# Patient Record
Sex: Male | Born: 1955 | Race: White | Hispanic: No | Marital: Married | Smoking: Former smoker
Health system: Southern US, Community
[De-identification: ages and names within clinical notes are randomized; demographics above are authoritative.]

## PROBLEM LIST (undated history)

## (undated) DIAGNOSIS — I10 Essential (primary) hypertension: Secondary | ICD-10-CM

## (undated) DIAGNOSIS — D3A029 Benign carcinoid tumor of the large intestine, unspecified portion: Secondary | ICD-10-CM

## (undated) DIAGNOSIS — E785 Hyperlipidemia, unspecified: Secondary | ICD-10-CM

## (undated) HISTORY — DX: Hyperlipidemia, unspecified: E78.5

## (undated) HISTORY — PX: APPENDECTOMY: SHX54

---

## 1998-06-18 ENCOUNTER — Encounter: Payer: Self-pay | Admitting: Emergency Medicine

## 1998-06-18 ENCOUNTER — Inpatient Hospital Stay (HOSPITAL_COMMUNITY): Admission: EM | Admit: 1998-06-18 | Discharge: 1998-06-19 | Payer: Self-pay | Admitting: Emergency Medicine

## 1998-06-19 ENCOUNTER — Encounter: Payer: Self-pay | Admitting: Cardiology

## 2008-03-02 ENCOUNTER — Encounter: Admission: RE | Admit: 2008-03-02 | Discharge: 2008-03-02 | Payer: Self-pay | Admitting: Obstetrics and Gynecology

## 2009-03-31 ENCOUNTER — Encounter: Admission: RE | Admit: 2009-03-31 | Discharge: 2009-03-31 | Payer: Self-pay | Admitting: Gastroenterology

## 2009-04-05 ENCOUNTER — Ambulatory Visit (HOSPITAL_COMMUNITY): Admission: EM | Admit: 2009-04-05 | Discharge: 2009-04-06 | Payer: Self-pay | Admitting: Emergency Medicine

## 2011-01-10 NOTE — Op Note (Signed)
[name], [name]              ACCOUNT NO.:  [id number]   MEDICAL RECORD NO.:  [id number]          PATIENT TYPE:  EMS   LOCATION:  ED                           FACILITY:  [facility]   PHYSICIAN:  James L. Malon Kindle., M.D.DATE OF BIRTH:  Oct 25, 1955   DATE OF PROCEDURE:  04/06/2009  DATE OF DISCHARGE:                               OPERATIVE REPORT   PROCEDURE:  Esophagogastroduodenoscopy with removal of food impaction.   MEDICATIONS:  Cetacaine spray, fentanyl 50 mcg, Versed 4 mg IV.   INDICATIONS:  The patient was eating steak.  He has been unable to  swallow ever since supper.  Had a barium swallow several days ago  showing reflux but no clear stricture.   DESCRIPTION OF PROCEDURE:  The procedure, including the risk and  benefits, was explained to the patient and wife and consent obtained.  In the  left lateral decubitus position, the Pentax upper scope was  inserted.  Esophagus was full of liquid.  We sucked this out.  There was  a large bolus of several pieces of meat in the distal esophagus.  With  air insufflation and the glucagon that had been given the ER and pushing  gently on this with the scope, the pieces passed on through into the  stomach area.  The area was irrigated and the rest of the pieces were  washed through the scope and easily passed.  A complete endoscopy was  performed and a large piece of meat was seen in the stomach.  Duodenum,  bulb and second portion was unremarkable.  Pyloric channel antrum and  body were all normal.  Scope was withdrawn back in the stomach.  There  was a widely patent GE junction but no stricture.  Was markedly  irritated and edematous and friable from the food impaction.  There were  no further residual pieces.  The scope was withdrawn.  The patient  tolerated the procedure well.   ASSESSMENT:  Food impaction with meat in the distal esophagus removed.   PLAN:  Will start the patient on Prilosec b.i.d. and have him follow up  in the  office with Dr. Evette Cristal in 2-3 weeks.           ______________________________  Llana Aliment Malon Kindle., M.D.     Waldron Session  D:  04/06/2009  T:  04/06/2009  Job:  [id number]   cc:   C. Duane Lope, M.D.  Fax: [phone]

## 2011-06-09 ENCOUNTER — Encounter (INDEPENDENT_AMBULATORY_CARE_PROVIDER_SITE_OTHER): Payer: Self-pay | Admitting: General Surgery

## 2011-06-09 ENCOUNTER — Ambulatory Visit (INDEPENDENT_AMBULATORY_CARE_PROVIDER_SITE_OTHER): Payer: BC Managed Care – PPO | Admitting: General Surgery

## 2011-06-09 VITALS — BP 152/98 | HR 64 | Temp 96.6°F | Resp 20 | Ht 68.0 in | Wt 180.1 lb

## 2011-06-09 DIAGNOSIS — K6389 Other specified diseases of intestine: Secondary | ICD-10-CM

## 2011-06-09 NOTE — Progress Notes (Signed)
Subjective:   Mass in colon  Patient ID: [name], male   DOB: 1955/11/08, 55 y.o.   MRN: [id number]  HPI  Patient is a 55 year old male here through the courtesy of Dr. Evette Cristal with a new finding of a submucosal cecal mass. The patient recently underwent a screening colonoscopy which was his initial full colonoscopy. He was having no preoperative symptoms. On questioning he has had no symptoms to suggest carcinoid syndrome. Colonoscopy revealed a small submucosal mass adjacent to the appendiceal orifice. It appeared to be about 1 cm. A biopsy was performed. A small portion of the biopsy revealed abnormal cells with clear cytoplasm of concern for a goblet cell carcinoid. Lipoma or other process could not be ruled out.  Past Medical History  Diagnosis Date  . Hyperlipidemia    Past Surgical History  Procedure Date  . Appendectomy    Current Outpatient Prescriptions  Medication Sig Dispense Refill  . fish oil-omega-3 fatty acids 1000 MG capsule Take 1,200 mg by mouth daily.        Marland Kitchen Specialty Vitamins Products (RA CENTRAL-VITE CARDIO PO) Take by mouth daily.         No Known Allergies History  Substance Use Topics  . Smoking status: Former Games developer  . Smokeless tobacco: Never Used  . Alcohol Use: Yes    Review of Systems  HENT: Positive for trouble swallowing.   Respiratory: Negative.   Cardiovascular: Negative.   Gastrointestinal: Negative for nausea, vomiting, abdominal pain, diarrhea, constipation, blood in stool and abdominal distention.  Genitourinary: Negative.   Hematological: Negative.        Objective:   Physical Exam General: Well-developed Caucasian male appears healthy Skin: Warm and dry no rash or infection HEENT: No palpable masses or thyromegaly. Sclera nonicteric. Lymph nodes: No cervical, supraclavicular, or inguinal nodes palpable Lungs: Clear without wheezing or increased work of breathing Cardiovascular: Regular rate and rhythm without murmur. No  edema or JVD. Peripheral pulses intact. Abdomen: Nondistended. Well-healed incision right lower quadrant without hernias. Soft and nontender without palpable masses or organomegaly. Extremities: No joint swelling or deformity Neurologic: Alert and Foley were noted. Gait normal.    Assessment:     Healthy 55 year old male with a small submucosal mass noted in the cecum near the appendiceal orifice. He is status post open appendectomy in his 18s. Biopsy has suggested the possibility of a carcinoid tumor. Lipoma was also mentioned in the differential diagnosis pathologically. We can also occasionally see a submucosal mass due to an inverted appendiceal stump post appendectomy.  Dr. Evette Cristal is out of town today I would like to discuss personally with him his impression of colonoscopy. If any more definitive biopsy could be obtained this might be helpful to try to avoid surgery. However based on the current findings I think there is a significant risk that this is a carcinoid tumor and that he would be best served with resection. This could be done laparoscopically.  I discussed this all with the patient including possible diagnoses and indications and nature of the surgery. We discussed risks of anesthetic complications, bleeding, infection, anastomotic leak, possible need for procedure. I went back in touch with him this is a chance to discuss this with Dr. Evette Cristal.    Plan:     We'll discuss the possibility of further diagnostic procedure with Dr. Evette Cristal. If this is not felt to be feasible then we will plan to proceed with laparoscopic partial colectomy.

## 2011-06-09 NOTE — Patient Instructions (Signed)
I will call Monday orTuesday after speaking to Dr Evette Cristal

## 2011-06-13 ENCOUNTER — Other Ambulatory Visit (INDEPENDENT_AMBULATORY_CARE_PROVIDER_SITE_OTHER): Payer: Self-pay

## 2011-07-13 ENCOUNTER — Other Ambulatory Visit (INDEPENDENT_AMBULATORY_CARE_PROVIDER_SITE_OTHER): Payer: Self-pay

## 2011-07-31 ENCOUNTER — Encounter (HOSPITAL_COMMUNITY): Payer: Self-pay

## 2011-08-03 ENCOUNTER — Encounter (HOSPITAL_COMMUNITY): Payer: Self-pay

## 2011-08-03 ENCOUNTER — Ambulatory Visit (HOSPITAL_COMMUNITY)
Admission: RE | Admit: 2011-08-03 | Discharge: 2011-08-03 | Disposition: A | Discharge status: Home / Self Care | Payer: BC Managed Care – PPO | Source: Ambulatory Visit | Attending: General Surgery | Admitting: General Surgery

## 2011-08-03 ENCOUNTER — Encounter (HOSPITAL_COMMUNITY)
Admission: RE | Admit: 2011-08-03 | Discharge: 2011-08-03 | Disposition: A | Discharge status: Home / Self Care | Payer: BC Managed Care – PPO | Source: Ambulatory Visit | Attending: General Surgery | Admitting: General Surgery

## 2011-08-03 ENCOUNTER — Other Ambulatory Visit: Payer: Self-pay

## 2011-08-03 DIAGNOSIS — Z01812 Encounter for preprocedural laboratory examination: Secondary | ICD-10-CM | POA: Insufficient documentation

## 2011-08-03 DIAGNOSIS — I10 Essential (primary) hypertension: Secondary | ICD-10-CM | POA: Insufficient documentation

## 2011-08-03 DIAGNOSIS — K639 Disease of intestine, unspecified: Secondary | ICD-10-CM | POA: Insufficient documentation

## 2011-08-03 DIAGNOSIS — Z01818 Encounter for other preprocedural examination: Secondary | ICD-10-CM | POA: Insufficient documentation

## 2011-08-03 DIAGNOSIS — Z01810 Encounter for preprocedural cardiovascular examination: Secondary | ICD-10-CM | POA: Insufficient documentation

## 2011-08-03 HISTORY — DX: Essential (primary) hypertension: I10

## 2011-08-03 LAB — BASIC METABOLIC PANEL
Calcium: 9.7 mg/dL (ref 8.4–10.5)
GFR calc Af Amer: >90 mL/min (ref >90)
GFR calc non Af Amer: >90 mL/min (ref >90)
Glucose, Bld: 93 mg/dL (ref 70–99)
Potassium: 4.2 mEq/L (ref 3.5–5.1)
Sodium: 140 mEq/L (ref 135–145)

## 2011-08-03 LAB — SURGICAL PCR SCREEN
MRSA, PCR: NEGATIVE
Staphylococcus aureus: NEGATIVE

## 2011-08-03 LAB — CBC
MCH: 29.2 pg (ref 26.0–34.0)
MCHC: 35 g/dL (ref 30.0–36.0)
Platelets: 131 10*3/uL — ABNORMAL LOW (ref 150–400)
RDW: 12.5 % (ref 11.5–15.5)

## 2011-08-03 NOTE — Pre-Procedure Instructions (Signed)
EKG AND CXR WERE DONE TODAY AT [facility] PREOP BECAUSE OF HX OF HYPERTENSION AND B/P ELEVATED TODAY

## 2011-08-03 NOTE — Patient Instructions (Signed)
20 RECE ZECHMAN  08/03/2011   Your procedure is scheduled on:  Tuesday 12/11 AT 8:30 AM  Report to [facility] at 6:30 AM.  Call this number if you have problems the morning of surgery: [phone]   Remember:FOLLOW BOWEL PREP AND CLEAR LIQUID DIET INSTRUCTIONS FROM DR. HOXWORTH'S OFFICE   Do not eat food OR DRINK ANYTHING AFTER MIDNIGHT THE NIGHT BEFORE YOUR SURGERY  NO ASPIRIN OR HERBAL MEDS OR BLOOD THINNERS 7 DAYS PRIOR TO SURGERY  Take these medicines the morning of surgery with A SIP OF WATER: NO MEDICINE TO TAKE   Do not wear jewelry, make-up or nail polish.  Do not wear lotions, powders, or perfumes. You may wear deodorant.  Do not shave 48 hours prior to surgery.  Do not bring valuables to the hospital.  Contacts, dentures or bridgework may not be worn into surgery.  Leave suitcase in the car. After surgery it may be brought to your room.  For patients admitted to the hospital, checkout time is 11:00 AM the day of discharge.   Patients discharged the day of surgery will not be allowed to drive home.    Special Instructions: CHG Shower Use Special Wash: 1/2 bottle night before surgery and 1/2 bottle morning of surgery.   Please read over the following fact sheets that you were given: Blood Transfusion Information and MRSA Information

## 2011-08-08 ENCOUNTER — Inpatient Hospital Stay (HOSPITAL_COMMUNITY)
Admission: RE | Admit: 2011-08-08 | Discharge: 2011-08-11 | DRG: 149 | Disposition: A | Discharge status: Home / Self Care | Payer: BC Managed Care – PPO | Source: Ambulatory Visit | Attending: General Surgery | Admitting: General Surgery

## 2011-08-08 ENCOUNTER — Encounter (HOSPITAL_COMMUNITY): Payer: Self-pay | Admitting: Anesthesiology

## 2011-08-08 ENCOUNTER — Other Ambulatory Visit (INDEPENDENT_AMBULATORY_CARE_PROVIDER_SITE_OTHER): Payer: Self-pay | Admitting: General Surgery

## 2011-08-08 ENCOUNTER — Inpatient Hospital Stay (HOSPITAL_COMMUNITY): Payer: BC Managed Care – PPO | Admitting: Anesthesiology

## 2011-08-08 ENCOUNTER — Encounter (HOSPITAL_COMMUNITY)
Admission: RE | Disposition: A | Discharge status: Home / Self Care | Payer: Self-pay | Source: Ambulatory Visit | Attending: General Surgery

## 2011-08-08 ENCOUNTER — Encounter (HOSPITAL_COMMUNITY): Payer: Self-pay

## 2011-08-08 DIAGNOSIS — C180 Malignant neoplasm of cecum: Principal | ICD-10-CM | POA: Diagnosis present

## 2011-08-08 DIAGNOSIS — D3A029 Benign carcinoid tumor of the large intestine, unspecified portion: Secondary | ICD-10-CM

## 2011-08-08 DIAGNOSIS — D375 Neoplasm of uncertain behavior of rectum: Secondary | ICD-10-CM

## 2011-08-08 DIAGNOSIS — D371 Neoplasm of uncertain behavior of stomach: Secondary | ICD-10-CM

## 2011-08-08 DIAGNOSIS — D378 Neoplasm of uncertain behavior of other specified digestive organs: Secondary | ICD-10-CM

## 2011-08-08 DIAGNOSIS — I10 Essential (primary) hypertension: Secondary | ICD-10-CM | POA: Insufficient documentation

## 2011-08-08 HISTORY — DX: Benign carcinoid tumor of the large intestine, unspecified portion: D3A.029

## 2011-08-08 HISTORY — PX: OTHER SURGICAL HISTORY: SHX169

## 2011-08-08 LAB — CBC
HCT: 45.8 % (ref 39.0–52.0)
MCV: 83 fL (ref 78.0–100.0)
RBC: 5.52 MIL/uL (ref 4.22–5.81)
WBC: 12 10*3/uL — ABNORMAL HIGH (ref 4.0–10.5)

## 2011-08-08 LAB — CREATININE, SERUM: GFR calc Af Amer: >90 mL/min (ref >90)

## 2011-08-08 LAB — TYPE AND SCREEN
ABO/RH(D): A POS
Antibody Screen: NEGATIVE

## 2011-08-08 SURGERY — LAPAROSCOPIC PARTIAL COLECTOMY
Anesthesia: General | Site: Abdomen | Wound class: Clean Contaminated

## 2011-08-08 MED ORDER — LIDOCAINE HCL (CARDIAC) 20 MG/ML IV SOLN
INTRAVENOUS | Status: DC | PRN
  Administered 2011-08-08: 100 mg via INTRAVENOUS

## 2011-08-08 MED ORDER — ACETAMINOPHEN 10 MG/ML IV SOLN
INTRAVENOUS | Status: DC | PRN
  Administered 2011-08-08: 1000 mg via INTRAVENOUS

## 2011-08-08 MED ORDER — KCL IN DEXTROSE-NACL 20-5-0.9 MEQ/L-%-% IV SOLN
INTRAVENOUS | Status: DC
  Administered 2011-08-08 – 2011-08-10 (×4): via INTRAVENOUS
  Filled 2011-08-08 (×8): qty 1000

## 2011-08-08 MED ORDER — SUCCINYLCHOLINE CHLORIDE 20 MG/ML IJ SOLN
INTRAMUSCULAR | Status: DC | PRN
  Administered 2011-08-08: 100 mg via INTRAVENOUS

## 2011-08-08 MED ORDER — DEXTROSE 5 % IV SOLN
2.0000 g | INTRAVENOUS | Status: AC
  Administered 2011-08-08: 2 g via INTRAVENOUS

## 2011-08-08 MED ORDER — OXYCODONE-ACETAMINOPHEN 5-325 MG PO TABS: 1.0000 | ORAL_TABLET | ORAL | Status: DC | PRN

## 2011-08-08 MED ORDER — MIDAZOLAM HCL 5 MG/5ML IJ SOLN
INTRAMUSCULAR | Status: DC | PRN
  Administered 2011-08-08: 2 mg via INTRAVENOUS
  Administered 2011-08-08: 1 mg via INTRAVENOUS

## 2011-08-08 MED ORDER — ALVIMOPAN 12 MG PO CAPS
12.0000 mg | ORAL_CAPSULE | Freq: Two times a day (BID) | ORAL | Status: DC
  Administered 2011-08-09 – 2011-08-11 (×5): 12 mg via ORAL
  Filled 2011-08-08 (×6): qty 1

## 2011-08-08 MED ORDER — HYDROMORPHONE HCL PF 1 MG/ML IJ SOLN
INTRAMUSCULAR | Status: AC
  Filled 2011-08-08: qty 1

## 2011-08-08 MED ORDER — ONDANSETRON HCL 4 MG/2ML IJ SOLN
INTRAMUSCULAR | Status: DC | PRN
  Administered 2011-08-08: 4 mg via INTRAVENOUS

## 2011-08-08 MED ORDER — ROCURONIUM BROMIDE 100 MG/10ML IV SOLN
INTRAVENOUS | Status: DC | PRN
  Administered 2011-08-08 (×2): 10 mg via INTRAVENOUS
  Administered 2011-08-08: 50 mg via INTRAVENOUS

## 2011-08-08 MED ORDER — ACETAMINOPHEN 10 MG/ML IV SOLN
INTRAVENOUS | Status: AC
  Filled 2011-08-08: qty 100

## 2011-08-08 MED ORDER — SODIUM CHLORIDE 0.9 % IJ SOLN
INTRAMUSCULAR | Status: DC | PRN
  Administered 2011-08-08: 20 mL via INTRAVENOUS

## 2011-08-08 MED ORDER — HYDROMORPHONE HCL PF 1 MG/ML IJ SOLN
0.2500 mg | INTRAMUSCULAR | Status: DC | PRN
  Administered 2011-08-08: 0.5 mg via INTRAVENOUS

## 2011-08-08 MED ORDER — PROMETHAZINE HCL 25 MG/ML IJ SOLN: 6.2500 mg | INTRAMUSCULAR | Status: DC | PRN

## 2011-08-08 MED ORDER — SUFENTANIL CITRATE 50 MCG/ML IV SOLN
INTRAVENOUS | Status: DC | PRN
  Administered 2011-08-08: 5 ug via INTRAVENOUS
  Administered 2011-08-08 (×4): 10 ug via INTRAVENOUS
  Administered 2011-08-08: 15 ug via INTRAVENOUS
  Administered 2011-08-08 (×2): 10 ug via INTRAVENOUS
  Administered 2011-08-08: 15 ug via INTRAVENOUS
  Administered 2011-08-08 (×4): 10 ug via INTRAVENOUS
  Administered 2011-08-08: 5 ug via INTRAVENOUS

## 2011-08-08 MED ORDER — SODIUM CHLORIDE 0.9 % IR SOLN
Status: DC | PRN
  Administered 2011-08-08: 3000 mL

## 2011-08-08 MED ORDER — ONDANSETRON HCL 4 MG/2ML IJ SOLN: 4.0000 mg | Freq: Four times a day (QID) | INTRAMUSCULAR | Status: DC | PRN

## 2011-08-08 MED ORDER — DEXAMETHASONE SODIUM PHOSPHATE 10 MG/ML IJ SOLN
INTRAMUSCULAR | Status: DC | PRN
  Administered 2011-08-08: 10 mg via INTRAVENOUS

## 2011-08-08 MED ORDER — BUPIVACAINE LIPOSOME 1.3 % IJ SUSP
20.0000 mL | INTRAMUSCULAR | Status: AC
  Administered 2011-08-08: 20 mL
  Filled 2011-08-08: qty 20

## 2011-08-08 MED ORDER — MORPHINE SULFATE 2 MG/ML IJ SOLN
2.0000 mg | INTRAMUSCULAR | Status: DC | PRN
  Administered 2011-08-08 (×2): 4 mg via INTRAVENOUS
  Administered 2011-08-08 (×2): 2 mg via INTRAVENOUS
  Administered 2011-08-08: 6 mg via INTRAVENOUS
  Administered 2011-08-09: 4 mg via INTRAVENOUS
  Administered 2011-08-09: 2 mg via INTRAVENOUS
  Filled 2011-08-08: qty 1
  Filled 2011-08-08 (×2): qty 2
  Filled 2011-08-08: qty 1
  Filled 2011-08-08: qty 2
  Filled 2011-08-08: qty 1
  Filled 2011-08-08: qty 3
  Filled 2011-08-08: qty 1

## 2011-08-08 MED ORDER — CEFOXITIN SODIUM-DEXTROSE 1-4 GM-% IV SOLR (PREMIX)
INTRAVENOUS | Status: AC
  Filled 2011-08-08: qty 100

## 2011-08-08 MED ORDER — ALVIMOPAN 12 MG PO CAPS
12.0000 mg | ORAL_CAPSULE | Freq: Once | ORAL | Status: AC
  Administered 2011-08-08: 12 mg via ORAL

## 2011-08-08 MED ORDER — NEOSTIGMINE METHYLSULFATE 1 MG/ML IJ SOLN
INTRAMUSCULAR | Status: DC | PRN
  Administered 2011-08-08: 4 mg via INTRAVENOUS

## 2011-08-08 MED ORDER — METOPROLOL TARTRATE 1 MG/ML IV SOLN
5.0000 mg | INTRAVENOUS | Status: DC
  Administered 2011-08-08 – 2011-08-09 (×5): 5 mg via INTRAVENOUS
  Filled 2011-08-08 (×11): qty 5

## 2011-08-08 MED ORDER — GLYCOPYRROLATE 0.2 MG/ML IJ SOLN
INTRAMUSCULAR | Status: DC | PRN
  Administered 2011-08-08: .6 mg via INTRAVENOUS

## 2011-08-08 MED ORDER — LACTATED RINGERS IV SOLN
INTRAVENOUS | Status: DC | PRN
  Administered 2011-08-08 (×3): via INTRAVENOUS

## 2011-08-08 MED ORDER — HEPARIN SODIUM (PORCINE) 5000 UNIT/ML IJ SOLN
5000.0000 [IU] | Freq: Three times a day (TID) | INTRAMUSCULAR | Status: DC
  Administered 2011-08-09 – 2011-08-10 (×4): 5000 [IU] via SUBCUTANEOUS
  Filled 2011-08-08 (×7): qty 1

## 2011-08-08 MED ORDER — PROPOFOL 10 MG/ML IV EMUL
INTRAVENOUS | Status: DC | PRN
  Administered 2011-08-08: 30 mg via INTRAVENOUS
  Administered 2011-08-08: 200 mg via INTRAVENOUS
  Administered 2011-08-08: 30 mg via INTRAVENOUS

## 2011-08-08 MED ORDER — ONDANSETRON HCL 4 MG PO TABS: 4.0000 mg | ORAL_TABLET | Freq: Four times a day (QID) | ORAL | Status: DC | PRN

## 2011-08-08 SURGICAL SUPPLY — 77 items
APPLIER CLIP 5 13 M/L LIGAMAX5 (MISCELLANEOUS) ×2
APPLIER CLIP ROT 10 11.4 M/L (STAPLE) ×2
BAG URINE DRAINAGE (UROLOGICAL SUPPLIES) IMPLANT
BAG URO CATCHER STRL LF (DRAPE) IMPLANT
BLADE EXTENDED COATED 6.5IN (ELECTRODE) ×2 IMPLANT
BLADE HEX COATED 2.75 (ELECTRODE) ×2 IMPLANT
BLADE SURG SZ10 CARB STEEL (BLADE) ×2 IMPLANT
CABLE HIGH FREQUENCY MONO STRZ (ELECTRODE) ×2 IMPLANT
CANISTER SUCTION 2500CC (MISCELLANEOUS) ×2 IMPLANT
CATH FOLEY SILVER 30CC 28FR (CATHETERS) IMPLANT
CELLS DAT CNTRL 66122 CELL SVR (MISCELLANEOUS) ×1 IMPLANT
CLIP APPLIE 5 13 M/L LIGAMAX5 (MISCELLANEOUS) ×1 IMPLANT
CLIP APPLIE ROT 10 11.4 M/L (STAPLE) ×1 IMPLANT
CLOTH BEACON ORANGE TIMEOUT ST (SAFETY) ×2 IMPLANT
COVER MAYO STAND STRL (DRAPES) ×2 IMPLANT
DECANTER SPIKE VIAL GLASS SM (MISCELLANEOUS) ×2 IMPLANT
DERMABOND ADVANCED (GAUZE/BANDAGES/DRESSINGS) ×1
DERMABOND ADVANCED .7 DNX12 (GAUZE/BANDAGES/DRESSINGS) ×1 IMPLANT
DRAIN CHANNEL 19F RND (DRAIN) ×2 IMPLANT
DRAPE LAPAROSCOPIC ABDOMINAL (DRAPES) ×2 IMPLANT
DRAPE LG THREE QUARTER DISP (DRAPES) ×2 IMPLANT
DRAPE WARM FLUID 44X44 (DRAPE) ×4 IMPLANT
ELECT REM PT RETURN 9FT ADLT (ELECTROSURGICAL) ×2
ELECTRODE REM PT RTRN 9FT ADLT (ELECTROSURGICAL) ×1 IMPLANT
FILTER SMOKE EVAC LAPAROSHD (FILTER) IMPLANT
GLOVE BIOGEL PI IND STRL 7.0 (GLOVE) ×1 IMPLANT
GLOVE BIOGEL PI INDICATOR 7.0 (GLOVE) ×1
GLOVE ECLIPSE 8.0 STRL XLNG CF (GLOVE) ×4 IMPLANT
GLOVE INDICATOR 8.0 STRL GRN (GLOVE) ×2 IMPLANT
GOWN STRL NON-REIN LRG LVL3 (GOWN DISPOSABLE) ×2 IMPLANT
GOWN STRL REIN XL XLG (GOWN DISPOSABLE) ×4 IMPLANT
GRASPER LAPSCPC 5X35 EPIX (ENDOMECHANICALS) IMPLANT
HAND ACTIVATED (MISCELLANEOUS) IMPLANT
KIT BASIN OR (CUSTOM PROCEDURE TRAY) ×2 IMPLANT
LEGGING LITHOTOMY PAIR STRL (DRAPES) ×2 IMPLANT
LIGASURE IMPACT 36 18CM CVD LR (INSTRUMENTS) IMPLANT
NS IRRIG 1000ML POUR BTL (IV SOLUTION) ×2 IMPLANT
PENCIL BUTTON HOLSTER BLD 10FT (ELECTRODE) ×2 IMPLANT
PUMP PAIN ON-Q (MISCELLANEOUS) IMPLANT
RTRCTR WOUND ALEXIS 18CM MED (MISCELLANEOUS) ×2
SCALPEL HARMONIC ACE (MISCELLANEOUS) ×2 IMPLANT
SCISSORS LAP 5X35 DISP (ENDOMECHANICALS) ×2 IMPLANT
SET IRRIG TUBING LAPAROSCOPIC (IRRIGATION / IRRIGATOR) ×2 IMPLANT
SLEEVE Z-THREAD 5X100MM (TROCAR) ×2 IMPLANT
SOLUTION ANTI FOG 6CC (MISCELLANEOUS) ×2 IMPLANT
SPONGE GAUZE 4X4 12PLY (GAUZE/BANDAGES/DRESSINGS) ×2 IMPLANT
SPONGE LAP 18X18 X RAY DECT (DISPOSABLE) ×4 IMPLANT
STAPLER GUN LINEAR PROX 60 (STAPLE) ×2 IMPLANT
STAPLER PROXIMATE 75MM BLUE (STAPLE) ×2 IMPLANT
STAPLER VISISTAT 35W (STAPLE) ×2 IMPLANT
SUCTION POOLE TIP (SUCTIONS) ×2 IMPLANT
SUT PDS AB 1 CT1 27 (SUTURE) ×2 IMPLANT
SUT PDS AB 1 CTX 36 (SUTURE) ×6 IMPLANT
SUT PDS AB 1 TP1 96 (SUTURE) IMPLANT
SUT PROLENE 2 0 KS (SUTURE) IMPLANT
SUT SILK 2 0 (SUTURE) ×1
SUT SILK 2 0 SH CR/8 (SUTURE) ×2 IMPLANT
SUT SILK 2-0 18XBRD TIE 12 (SUTURE) ×1 IMPLANT
SUT SILK 3 0 (SUTURE) ×1
SUT SILK 3 0 SH CR/8 (SUTURE) ×2 IMPLANT
SUT SILK 3-0 18XBRD TIE 12 (SUTURE) ×1 IMPLANT
SUT VIC AB 2-0 SH 18 (SUTURE) ×4 IMPLANT
SUT VICRYL 2 0 18  UND BR (SUTURE) ×2
SUT VICRYL 2 0 18 UND BR (SUTURE) ×2 IMPLANT
SYR 30ML LL (SYRINGE) ×2 IMPLANT
SYRINGE IRR TOOMEY STRL 70CC (SYRINGE) IMPLANT
SYS LAPSCP GELPORT 120MM (MISCELLANEOUS)
SYSTEM LAPSCP GELPORT 120MM (MISCELLANEOUS) IMPLANT
TOWEL OR 17X26 10 PK STRL BLUE (TOWEL DISPOSABLE) ×4 IMPLANT
TRAY FOLEY CATH 14FRSI W/METER (CATHETERS) ×2 IMPLANT
TRAY LAP CHOLE (CUSTOM PROCEDURE TRAY) ×2 IMPLANT
TROCAR Z-THREAD FIOS 11X100 BL (TROCAR) ×2 IMPLANT
TROCAR Z-THREAD FIOS 5X100MM (TROCAR) ×4 IMPLANT
TROCAR Z-THREAD SLEEVE 11X100 (TROCAR) IMPLANT
TUBING FILTER THERMOFLATOR (ELECTROSURGICAL) ×2 IMPLANT
YANKAUER SUCT BULB TIP 10FT TU (MISCELLANEOUS) ×2 IMPLANT
YANKAUER SUCT BULB TIP NO VENT (SUCTIONS) ×2 IMPLANT

## 2011-08-08 NOTE — Preoperative (Signed)
Beta Blockers   Reason not to administer Beta Blockers:Not Applicable 

## 2011-08-08 NOTE — H&P (Signed)
  Subjective:   Patient is a 55 year old male here through the courtesy of Dr. Evette Cristal with a new finding of a submucosal cecal mass. The patient recently underwent a screening colonoscopy which was his initial full colonoscopy. He was having no preoperative symptoms. On questioning he has had no symptoms to suggest carcinoid syndrome. Colonoscopy revealed a small submucosal mass adjacent to the appendiceal orifice. It appeared to be about 1 cm. A biopsy was performed. A small portion of the biopsy revealed abnormal cells with clear cytoplasm of concern for a goblet cell carcinoid. Lipoma or other process could not be ruled out. He is admited for partial colectomy     Objective:   BP 166/102  Pulse 87  Temp(Src) 97.5 F (36.4 C) (Oral)  Resp 20  SpO2 99% General: Alert, well-developed caucasian male, in no distress Skin: Warm and dry without rash or infection. HEENT: No palpable masses or thyromegaly. Sclera nonicteric. Pupils equal round and reactive. Oropharynx clear. Lymph nodes: No cervical, supraclavicular, or inguinal nodes palpable. Lungs: Breath sounds clear and equal without increased work of breathing Cardiovascular: Regular rate and rhythm without murmur. No JVD or edema. Peripheral pulses intact. Abdomen: Nondistended. Soft and nontender. No masses palpable. No organomegaly. No palpable hernias. Extremities: No edema or joint swelling or deformity. No chronic venous stasis changes. Neurologic: Alert and fully oriented. Gait normal.   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 Discussed the risk of surgery,  and the risks of general anesthetic including MI, CVA, sudden death or even reaction to anesthetic medications. The patient understands the risks, any and all questions were answered to the patient's satisfaction.   Mariella Saa MD, FACS  08/08/2011, 8:34 AM

## 2011-08-08 NOTE — Anesthesia Postprocedure Evaluation (Signed)
  Anesthesia Post-op Note  Patient: [name]  Procedure(s) Performed:  LAPAROSCOPIC PARTIAL COLECTOMY - Laparoscopic Partial Colectomy  Patient Location: PACU  Anesthesia Type: General  Level of Consciousness: oriented and sedated  Airway and Oxygen Therapy: Patient Spontanous Breathing and Patient connected to nasal cannula oxygen  Post-op Pain: mild  Post-op Assessment: Post-op Vital signs reviewed, Patient's Cardiovascular Status Stable, Respiratory Function Stable and Patent Airway  Post-op Vital Signs: stable  Complications: No apparent anesthesia complications

## 2011-08-08 NOTE — Transfer of Care (Signed)
Immediate Anesthesia Transfer of Care Note  Patient: [name]  Procedure(s) Performed:  LAPAROSCOPIC PARTIAL COLECTOMY - Laparoscopic Partial Colectomy  Patient Location: PACU  Anesthesia Type: General  Level of Consciousness: awake, alert , oriented and responds to stimulation  Airway & Oxygen Therapy: Patient Spontanous Breathing and Patient connected to face mask oxygen  Post-op Assessment: Report given to PACU RN and Post -op Vital signs reviewed and stable  Post vital signs: stable  Complications: No apparent anesthesia complications

## 2011-08-08 NOTE — Anesthesia Preprocedure Evaluation (Signed)
Anesthesia Evaluation  Patient identified by MRN, date of birth, ID band Patient awake    Reviewed: Allergy & Precautions, H&P , NPO status , Patient's Chart, lab work & pertinent test results, reviewed documented beta blocker date and time   Airway Mallampati: II  Neck ROM: Full    Dental  (+) Teeth Intact   Pulmonary neg pulmonary ROS,  clear to auscultation        Cardiovascular neg cardio ROS Regular Normal Pt denies hx of HTN, previous RX, no RX now Pt denies cardiac symptoms   Neuro/Psych Negative Neurological ROS  Negative Psych ROS   GI/Hepatic Neg liver ROS, Colon Mass   Endo/Other  Negative Endocrine ROS  Renal/GU negative Renal ROS  Genitourinary negative   Musculoskeletal negative musculoskeletal ROS (+)   Abdominal   Peds negative pediatric ROS (+)  Hematology negative hematology ROS (+)   Anesthesia Other Findings   Reproductive/Obstetrics negative OB ROS                           Anesthesia Physical Anesthesia Plan  ASA: II  Anesthesia Plan: General   Post-op Pain Management:    Induction: Intravenous  Airway Management Planned: Oral ETT  Additional Equipment:   Intra-op Plan:   Post-operative Plan: Extubation in OR  Informed Consent: I have reviewed the patients History and Physical, chart, labs and discussed the procedure including the risks, benefits and alternatives for the proposed anesthesia with the patient or authorized representative who has indicated his/her understanding and acceptance.     Plan Discussed with: CRNA and Surgeon  Anesthesia Plan Comments:         Anesthesia Quick Evaluation

## 2011-08-08 NOTE — Op Note (Signed)
Surgeon: Glenna Fellows T   Assistants: Harriette Bouillon M.D.  Anesthesia: General endotracheal anesthesia  Indications: patient is a 55 year old male who on recent screening colonoscopy was found to have a small subcutaneous mass in the cecum. A transmucosal needle biopsy was performed which was suggestive of but not diagnostic for a carcinoid tumor. After consultation in the office and discussion of options i.e. Resection versus observation we have elected to proceed with partial colectomy for removal. We have discussed the indications for the procedure, its nature and recovery, and risks of anesthetic complications, cardiopulmonary complications, bleeding, infection, anastomotic leak, and possible need for open procedure.    Procedure Detail : patient is brought to the operating room and placed in the supine position on the operating table. General endotracheal anesthesia was induced. He received preoperative IV antibiotics. Preop bowel prep and been performed. Foley catheter was placed. He was placed in lithotomy position with the thighs minimally flexed. The abdomen was widely sterilely prepped and draped. Access was obtained just beneath the umbilicus with an open 10 mm Hassan. Under direct vision 5 mm trochars were placed in the left lower quadrant and the left upper quadrant. There were noted to be extensive adhesions from his previous appendectomy. There were some omental adhesions in the left upper quadrant were taken down with the harmonic scalpel. There were dense omental adhesions into the right lower quadrant and down onto the cecum and right colon which were all carefully taken down with harmonic scalpel and careful blunt dissection. The cecum and right colon were exposed in the cecum was densely adherent to the lateral abdominal wall. Using careful mostly sharp dissection I mobilized the terminal ileum and the cecum away from these adhesions. I was unable to lift the terminal ileum  and cecum and the peritoneum was incised over the sacral promontory and careful blunt dissection carried medial to lateral beneath the mesentery of the right colon. The ureter was identified and protected. The right colon was mobilized extensively laterally out toward the peritoneal reflection. The ileocolic pedicle was identified by a light could not to divide it laparoscopically as I was doing a limited colectomy and there was still some distortion from adhesions. After full medial to lateral mobilization including identification of the duodenum the lateral attachments were divided with harmonic scalpel and bringing the terminal ileum cecum and right colon over to the midline. We carried this up to the hepatic flexure. At this point the abdomen was desufflated and the midline incision was extended to about 5 cm in length and the wound protector placed. We exteriorized the terminal ileum cecum and proximal right colon. Under direct vision I got a little more mobility of the more proximal right colon dividing some omental and peritoneal attachments under direct vision. This allowed adequate mobilization up to the proximal right colon. I was able to feel a very small firm freely movable mass in the cecal wall. Points of proximal and distal resection were chosen at the terminal ileum in about the mid right colon. These areas were cleaned of mesentery. The mesentery of the segment to be removed was then sequentially divided between clamps and tied with 2-0 silk ties. Following a complete mesenteric division a functional end-to-end anastomosis was created with a single firing of the 75 mm GIA stapler. The common enterotomy was closed and the specimen removed with a single firing of the TA 60 stapler. The anastomosis appeared widely patent and was certainly under no tension. A 2-0 silk suture was placed  at the crotch of the anastomosis for reinforcement. The bowel was returned to the abdominal cavity. A wound protector  was removed gloves changed. The midline fascia was closed with running #1 PDS begun at either end and tied centrally. The abdomen was then reinsufflated. The anastomosis was visualized and appeared to be lying normally in the right abdomen with no bleeding, no evidence of trocar or bowel injury and everything looked fine. All trochars were removed the CO2 evacuated. Skin incisions were closed with subcuticular Monocryl and Dermabond after infiltrating the soft tissue and fascia with Exparel local anesthetic. Sponge needle instrument counts were correct. The patient was taken to recovery in good condition.  Mariella Saa MD, FACS  08/08/2011, 11:23 AM   Estimated Blood Loss:  less than 50 mL         Drains: None       Blood Given: none          Specimens: Cecum and right colon        Complications:  * No complications entered in OR log *         Disposition: PACU - hemodynamically stable.         Condition: stable  Mariella Saa MD, FACS  08/08/2011, 11:25 AM

## 2011-08-09 ENCOUNTER — Encounter (HOSPITAL_COMMUNITY): Payer: Self-pay | Admitting: Surgery

## 2011-08-09 DIAGNOSIS — E785 Hyperlipidemia, unspecified: Secondary | ICD-10-CM | POA: Insufficient documentation

## 2011-08-09 DIAGNOSIS — D3A029 Benign carcinoid tumor of the large intestine, unspecified portion: Secondary | ICD-10-CM

## 2011-08-09 DIAGNOSIS — I10 Essential (primary) hypertension: Secondary | ICD-10-CM | POA: Insufficient documentation

## 2011-08-09 HISTORY — DX: Benign carcinoid tumor of the large intestine, unspecified portion: D3A.029

## 2011-08-09 LAB — CBC
HCT: 39.4 % (ref 39.0–52.0)
Hemoglobin: 13.4 g/dL (ref 13.0–17.0)
RDW: 13.1 % (ref 11.5–15.5)
WBC: 12 10*3/uL — ABNORMAL HIGH (ref 4.0–10.5)

## 2011-08-09 MED ORDER — FLORA-Q PO CAPS
1.0000 | ORAL_CAPSULE | Freq: Every day | ORAL | Status: DC
  Administered 2011-08-09 – 2011-08-11 (×3): 1 via ORAL
  Filled 2011-08-09 (×3): qty 1

## 2011-08-09 MED ORDER — DIPHENHYDRAMINE HCL 50 MG/ML IJ SOLN: 12.5000 mg | Freq: Four times a day (QID) | INTRAMUSCULAR | Status: DC | PRN

## 2011-08-09 MED ORDER — GUAIFENESIN-DM 100-10 MG/5ML PO SYRP: 15.0000 mL | ORAL_SOLUTION | ORAL | Status: DC | PRN

## 2011-08-09 MED ORDER — PSYLLIUM 95 % PO PACK
1.0000 | PACK | Freq: Two times a day (BID) | ORAL | Status: DC
  Administered 2011-08-09 – 2011-08-11 (×4): 1 via ORAL
  Filled 2011-08-09 (×5): qty 1

## 2011-08-09 MED ORDER — BISACODYL 10 MG RE SUPP: 10.0000 mg | Freq: Two times a day (BID) | RECTAL | Status: DC | PRN

## 2011-08-09 MED ORDER — MAGIC MOUTHWASH
15.0000 mL | Freq: Four times a day (QID) | ORAL | Status: DC | PRN
  Filled 2011-08-09: qty 15

## 2011-08-09 MED ORDER — ACETAMINOPHEN 325 MG PO TABS
325.0000 mg | ORAL_TABLET | Freq: Four times a day (QID) | ORAL | Status: DC | PRN
  Filled 2011-08-09 (×2): qty 2

## 2011-08-09 MED ORDER — METOPROLOL TARTRATE 1 MG/ML IV SOLN
5.0000 mg | Freq: Four times a day (QID) | INTRAVENOUS | Status: DC | PRN
  Filled 2011-08-09: qty 5

## 2011-08-09 MED ORDER — OXYCODONE HCL 5 MG PO TABS: 5.0000 mg | ORAL_TABLET | ORAL | Status: DC | PRN

## 2011-08-09 MED ORDER — NAPROXEN 500 MG PO TABS
500.0000 mg | ORAL_TABLET | Freq: Two times a day (BID) | ORAL | Status: DC
  Administered 2011-08-09 – 2011-08-10 (×2): 500 mg via ORAL
  Filled 2011-08-09 (×3): qty 1

## 2011-08-09 MED ORDER — ALBUTEROL SULFATE (5 MG/ML) 0.5% IN NEBU: 2.5000 mg | INHALATION_SOLUTION | Freq: Four times a day (QID) | RESPIRATORY_TRACT | Status: DC | PRN

## 2011-08-09 MED ORDER — PSYLLIUM 95 % PO PACK
1.0000 | PACK | Freq: Two times a day (BID) | ORAL | Status: DC
  Filled 2011-08-09 (×3): qty 1

## 2011-08-09 MED ORDER — METOPROLOL TARTRATE 25 MG PO TABS
25.0000 mg | ORAL_TABLET | Freq: Two times a day (BID) | ORAL | Status: DC
  Administered 2011-08-09 – 2011-08-11 (×4): 25 mg via ORAL
  Filled 2011-08-09 (×5): qty 1

## 2011-08-09 NOTE — Progress Notes (Signed)
[name] Apr 15, 1956 [id number]  PCP: Daisy Floro, MD  Outpatient Care Team: Patient Care Team: Daisy Floro as PCP - General (Family Medicine)  Inpatient Treatment Team: Treatment Team: Attending Provider: Mariella Saa, MD; Technician: Vella Raring, NT; Surgeon: Ardeth Sportsman, MD; Technician: Dene Gentry Long, NT   LOS: 1 day   1 Day Post-Op  Procedure(s): LAPAROSCOPIC PARTIAL COLECTOMY  Subjective:  Denies much pain Walking in room Wife wanting him to start eating RN in room   Objective:  Vital signs:  Temp:  [97.8 F (36.6 C)-99.1 F (37.3 C)] 98.3 F (36.8 C) (12/12 0620) Pulse Rate:  [70-108] 83  (12/12 0620) Resp:  [14-20] 18  (12/12 0620) BP: (134-195)/(85-118) 134/85 mmHg (12/12 0620) SpO2:  [97 %-100 %] 97 % (12/12 0620) Weight:  [178 lb (80.74 kg)] 178 lb (80.74 kg) (12/11 1325) Last BM Date: 08/07/11  Intake/Output    from previous day: 12/11 0701 - 12/12 0700 In: 4454.2 [I.V.:4454.2] Out: 2725 [Urine:2675; Blood:50]  this shift: Total I/O In: 0  Out: 480 [Urine:480]  Flatus: no ("but I do have rumblings!") BM: no  Physical Exam:  General: Pt awake/alert/oriented x4 in no acute distress Eyes: PERRL, normal EOM.  Sclera clear.  No icterus Neuro: CN II-XII intact w/o focal sensory/motor deficits. Lymph: No head/neck/groin lymphadenopathy Psych:  No delerium/psychosis/paranoia HENT: Normocephalic, Mucus membranes moist.  No thrush Neck: Supple, No tracheal deviation Chest: Clear  No chest wall pain w good excursion CV:  Pulses intact.  Regular rhythm Abdomen: Soft, Nontender/Nondistended.  No incarcerated hernias.  Incisons clean/dry Ext:  SCDs BLE.  No mjr edema.  No cyanosis Skin: No petechiae / purpurae  Results:   Labs: Results for orders placed during the hospital encounter of 08/08/11 (from the past 48 hour(s))  ABO/RH     Status: Normal   Collection Time   08/08/11  7:00 AM      Component  Value Range Comment   ABO/RH(D) A POS     TYPE AND SCREEN     Status: Normal   Collection Time   08/08/11  7:15 AM      Component Value Range Comment   ABO/RH(D) A POS      Antibody Screen NEG      Sample Expiration 08/11/2011     CBC     Status: Abnormal   Collection Time   08/08/11  3:06 PM      Component Value Range Comment   WBC 12.0 (*) 4.0 - 10.5 (K/uL)    RBC 5.52  4.22 - 5.81 (MIL/uL)    Hemoglobin 16.0  13.0 - 17.0 (g/dL)    HCT 11.9  14.7 - 82.9 (%)    MCV 83.0  78.0 - 100.0 (fL)    MCH 29.0  26.0 - 34.0 (pg)    MCHC 34.9  30.0 - 36.0 (g/dL)    RDW 56.2  13.0 - 86.5 (%)    Platelets 130 (*) 150 - 400 (K/uL)   CREATININE, SERUM     Status: Normal   Collection Time   08/08/11  3:06 PM      Component Value Range Comment   Creatinine, Ser 0.80  0.50 - 1.35 (mg/dL)    GFR calc non Af Amer >90  >90 (mL/min)    GFR calc Af Amer >90  >90 (mL/min)   CBC     Status: Abnormal   Collection Time   08/09/11  3:40 AM      Component  Value Range Comment   WBC 12.0 (*) 4.0 - 10.5 (K/uL)    RBC 4.68  4.22 - 5.81 (MIL/uL)    Hemoglobin 13.4  13.0 - 17.0 (g/dL)    HCT 16.1  09.6 - 04.5 (%)    MCV 84.2  78.0 - 100.0 (fL)    MCH 28.6  26.0 - 34.0 (pg)    MCHC 34.0  30.0 - 36.0 (g/dL)    RDW 40.9  81.1 - 91.4 (%)    Platelets 149 (*) 150 - 400 (K/uL)     Imaging / Studies: @RISRSLT24 @  Antibiotics: Anti-infectives     Start     Dose/Rate Route Frequency Ordered Stop   08/08/11 0645   cefOXitin (MEFOXIN) 2 g in dextrose 5 % 50 mL IVPB        2 g 100 mL/hr over 30 Minutes Intravenous 60 min pre-op 08/08/11 7829 08/08/11 0840          Medications / Allergies: per chart  Assessment / Plan: [name]  55 y.o. 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 Gastrointestinal and Minimally Invasive Surgery [facility] [address], [address] [place], [place] [address] [phone] Main / Paging [phone] Voice Mail   08/09/2011

## 2011-08-09 NOTE — Progress Notes (Signed)
Received report from night shift nurse. Patient laying in bed, awake, alert and oriented. Denies need for pain med at this time.

## 2011-08-10 MED ORDER — SODIUM CHLORIDE 0.9 % IJ SOLN: 3.0000 mL | INTRAMUSCULAR | Status: DC | PRN

## 2011-08-10 MED ORDER — ACETAMINOPHEN 325 MG PO TABS
650.0000 mg | ORAL_TABLET | Freq: Four times a day (QID) | ORAL | Status: DC
  Administered 2011-08-10 – 2011-08-11 (×4): 650 mg via ORAL
  Filled 2011-08-10 (×5): qty 2

## 2011-08-10 MED ORDER — LACTATED RINGERS IV BOLUS (SEPSIS): 1000.0000 mL | Freq: Three times a day (TID) | INTRAVENOUS | Status: DC | PRN

## 2011-08-10 MED ORDER — SODIUM CHLORIDE 0.9 % IJ SOLN
3.0000 mL | Freq: Two times a day (BID) | INTRAMUSCULAR | Status: DC
  Administered 2011-08-10 – 2011-08-11 (×2): 3 mL via INTRAVENOUS

## 2011-08-10 NOTE — Progress Notes (Signed)
[name] Feb 10, 1956 [id number]  PCP: Daisy Floro, MD  Outpatient Care Team: Patient Care Team: Daisy Floro as PCP - General (Family Medicine)  Inpatient Treatment Team: Treatment Team: Attending Provider: Mariella Saa, MD; Technician: Vella Raring, NT; Surgeon: Ardeth Sportsman, MD; Technician: Dene Gentry Long, NT; Registered Nurse: Pervis Hocking, RN; Technician: Gala Murdoch, NT   LOS: 2 days   2 Days Post-Op  Procedure(s): LAPAROSCOPIC PARTIAL COLECTOMY  Subjective:  Denies much pain Walking in hallways a lot Wife in room  No N/V.   Tol PO liquids well  Objective:  Vital signs:  Temp:  [97.9 F (36.6 C)-98.8 F (37.1 C)] 98.8 F (37.1 C) (12/12 2205) Pulse Rate:  [80-83] 80  (12/12 2205) Resp:  [18] 18  (12/12 2205) BP: (130-144)/(81-85) 130/81 mmHg (12/12 2205) SpO2:  [97 %-98 %] 97 % (12/12 2205) Last BM Date: 08/07/11  Intake/Output    from previous day: 12/12 0701 - 12/13 0700 In: 660 [P.O.:360; I.V.:300] Out: 1605 [Urine:1605]  this shift: Total I/O In: 420 [P.O.:120; I.V.:300] Out: 200 [Urine:200]  Flatus: scant  BM: no  Physical Exam:  General: Pt awake/alert/oriented x4 in no acute distress Eyes: PERRL, normal EOM.  Sclera clear.  No icterus Neuro: CN II-XII intact w/o focal sensory/motor deficits. Lymph: No head/neck/groin lymphadenopathy Psych:  No delerium/psychosis/paranoia HENT: Normocephalic, Mucus membranes moist.  No thrush Neck: Supple, No tracheal deviation Chest: Clear  No chest wall pain w good excursion CV:  Pulses intact.  Regular rhythm Abdomen: Soft, Minimally tender/Nondistended.  No incarcerated hernias.  Incisons clean/dry Ext:  SCDs BLE.  No mjr edema.  No cyanosis Skin: No petechiae / purpurae  Results:   Labs: Results for orders placed during the hospital encounter of 08/08/11 (from the past 48 hour(s))  ABO/RH     Status: Normal   Collection Time   08/08/11  7:00  AM      Component Value Range Comment   ABO/RH(D) A POS     TYPE AND SCREEN     Status: Normal   Collection Time   08/08/11  7:15 AM      Component Value Range Comment   ABO/RH(D) A POS      Antibody Screen NEG      Sample Expiration 08/11/2011     CBC     Status: Abnormal   Collection Time   08/08/11  3:06 PM      Component Value Range Comment   WBC 12.0 (*) 4.0 - 10.5 (K/uL)    RBC 5.52  4.22 - 5.81 (MIL/uL)    Hemoglobin 16.0  13.0 - 17.0 (g/dL)    HCT 56.2  13.0 - 86.5 (%)    MCV 83.0  78.0 - 100.0 (fL)    MCH 29.0  26.0 - 34.0 (pg)    MCHC 34.9  30.0 - 36.0 (g/dL)    RDW 78.4  69.6 - 29.5 (%)    Platelets 130 (*) 150 - 400 (K/uL)   CREATININE, SERUM     Status: Normal   Collection Time   08/08/11  3:06 PM      Component Value Range Comment   Creatinine, Ser 0.80  0.50 - 1.35 (mg/dL)    GFR calc non Af Amer >90  >90 (mL/min)    GFR calc Af Amer >90  >90 (mL/min)   CBC     Status: Abnormal   Collection Time   08/09/11  3:40 AM  Component Value Range Comment   WBC 12.0 (*) 4.0 - 10.5 (K/uL)    RBC 4.68  4.22 - 5.81 (MIL/uL)    Hemoglobin 13.4  13.0 - 17.0 (g/dL)    HCT 95.2  84.1 - 32.4 (%)    MCV 84.2  78.0 - 100.0 (fL)    MCH 28.6  26.0 - 34.0 (pg)    MCHC 34.0  30.0 - 36.0 (g/dL)    RDW 40.1  02.7 - 25.3 (%)    Platelets 149 (*) 150 - 400 (K/uL)     Imaging / Studies: @RISRSLT24 @  Antibiotics: Anti-infectives     Start     Dose/Rate Route Frequency Ordered Stop   08/08/11 0645   cefOXitin (MEFOXIN) 2 g in dextrose 5 % 50 mL IVPB        2 g 100 mL/hr over 30 Minutes Intravenous 60 min pre-op 08/08/11 6644 08/08/11 0840          Medications / Allergies: per chart  Assessment / Plan: Lauris Chroman  55 y.o. 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 Gastrointestinal and Minimally Invasive Surgery [facility] [address], [address] [place], [place] [address] [phone] Main / Paging [phone] Voice Mail   08/10/2011

## 2011-08-11 ENCOUNTER — Encounter (HOSPITAL_COMMUNITY): Payer: Self-pay | Admitting: Surgery

## 2011-08-11 MED ORDER — OXYCODONE HCL 5 MG PO TABS: 5.0000 mg | ORAL_TABLET | ORAL | Status: AC | PRN

## 2011-08-11 NOTE — Discharge Summary (Signed)
[name] Jul 29, 1956 [id number]  PCP: Daisy Floro, MD  Outpatient Care Team: Patient Care Team: Daisy Floro as PCP - General (Family Medicine)  Inpatient Treatment Team: Treatment Team: Attending Provider: Mariella Saa, MD; Technician: Vella Raring, NT; Surgeon: Ardeth Sportsman, MD; Technician: Dene Gentry Long, NT; Registered Nurse: Pervis Hocking, RN; Respiratory Therapist: Higinio Plan, RRT; Registered Nurse: Pascal Lux, RN; Registered Nurse: Myrtie Hawk Means, RN   LOS: 3 days   3 Days Post-Op  Procedure(s): LAPAROSCOPIC PARTIAL COLECTOMY  Hospital Course Denies much pain  Walking in hallways a lot  Wife in room  No N/V.  Tol PO solids   Some blood in BM yesterday - decreased now   Objective:  Vital signs:  Temp:  [97.5 F (36.4 C)-98.4 F (36.9 C)] 98.2 F (36.8 C) (12/14 0600) Pulse Rate:  [80-89] 84  (12/14 0600) Resp:  [16-18] 16  (12/14 0600) BP: (131-151)/(85-89) 148/85 mmHg (12/14 0600) SpO2:  [97 %-98 %] 97 % (12/14 0600) Last BM Date: 08/11/11  Intake/Output    from previous day: 12/13 0701 - 12/14 0700 In: 1017 [P.O.:1017] Out: 3450 [Urine:3450]  this shift: Total I/O In: 360 [P.O.:360] Out: -   Flatus: Yes BM: Yes.  Scant blood mixed in  Physical Exam:  General: Pt awake/alert/oriented x4 in no acute distress  Eyes: PERRL, normal EOM. Sclera clear. No icterus  Neuro: CN II-XII intact w/o focal sensory/motor deficits.  Lymph: No head/neck/groin lymphadenopathy  Psych: No delerium/psychosis/paranoia  HENT: Normocephalic, Mucus membranes moist. No thrush  Neck: Supple, No tracheal deviation  Chest: Clear No chest wall pain w good excursion  CV: Pulses intact. Regular rhythm  Abdomen: Soft, Minimally tender/Nondistended. No incarcerated hernias. Incisons clean/dry  Ext: SCDs BLE. No mjr edema. No cyanosis  Skin: No petechiae / purpurae  Results:   Labs: No results found  for this or any previous visit (from the past 48 hour(s)).  Imaging / Studies: @RISRSLT24 @  Antibiotics: Anti-infectives     Start     Dose/Rate Route Frequency Ordered Stop   08/08/11 0645   cefOXitin (MEFOXIN) 2 g in dextrose 5 % 50 mL IVPB        2 g 100 mL/hr over 30 Minutes Intravenous 60 min pre-op 08/08/11 9147 08/08/11 0840          Medications / Allergies: per chart  Assessment / Plan: [name]  55 y.o. 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  To d/w Dr. Johna Sheriff on return visit Hypertension   -prob stress related.  Hold meds & re-evaluate if needed as outpt  -High fiber diet -mobilize as tolerated to help recovery  -bowel regimen   RTC ~2weeks  Lorenso Courier, M.D., F.A.C.S. Gastrointestinal and Minimally Invasive Surgery [facility] [address] [place], [place] [address] [phone] Main / Paging [phone] Voice Mail

## 2011-08-25 ENCOUNTER — Ambulatory Visit (INDEPENDENT_AMBULATORY_CARE_PROVIDER_SITE_OTHER): Payer: BC Managed Care – PPO | Admitting: General Surgery

## 2011-08-25 ENCOUNTER — Encounter (INDEPENDENT_AMBULATORY_CARE_PROVIDER_SITE_OTHER): Payer: Self-pay | Admitting: General Surgery

## 2011-08-25 VITALS — BP 140/84 | Ht 68.0 in | Wt 174.8 lb

## 2011-08-25 DIAGNOSIS — D3A012 Benign carcinoid tumor of the ileum: Secondary | ICD-10-CM

## 2011-08-25 NOTE — Progress Notes (Signed)
Patient returns approaching 3 weeks following laparoscopic partial colectomy for carcinoid tumor. Preoperatively this appeared to be in the cecum submucosally. Final pathology showed 2 carcinoid tumors in the extreme terminal ileum one measuring 1.5 cm and the other 0.7 cm. He had extensive adhesions from previous perforated appendicitis with his terminal ileum densely adherent to the cecum and I suspect that this was biopsied through the wall of the cecum preoperatively due to the adhesions. All nodes were negative.  The patient has been feeling well without any particular difficulties at home. He is anxious to return to work. He is eating okay and bowels are moving regularly. Has just mild soreness.  On exam he appears well. Abdomen is soft and nontender. Wounds are well healed without complication.  Assessment and plan: Doing well following laparoscopic ileocolonic resection for carcinoid tumor. There is no additional treatment recommended. I've encouraged him to have another colonoscopy now later than 5 years. He is to return to work next week. I will see him in 4 weeks for a final postop check.

## 2011-10-06 ENCOUNTER — Ambulatory Visit (INDEPENDENT_AMBULATORY_CARE_PROVIDER_SITE_OTHER): Payer: BC Managed Care – PPO | Admitting: General Surgery

## 2011-10-06 ENCOUNTER — Encounter (INDEPENDENT_AMBULATORY_CARE_PROVIDER_SITE_OTHER): Payer: Self-pay | Admitting: General Surgery

## 2011-10-06 VITALS — BP 152/96 | HR 70 | Temp 97.8°F | Resp 16 | Ht 68.0 in | Wt 177.2 lb

## 2011-10-06 DIAGNOSIS — D3A029 Benign carcinoid tumor of the large intestine, unspecified portion: Secondary | ICD-10-CM

## 2011-10-06 DIAGNOSIS — Z09 Encounter for follow-up examination after completed treatment for conditions other than malignant neoplasm: Secondary | ICD-10-CM

## 2011-10-06 NOTE — Progress Notes (Signed)
History: Patient returns for more long-term followup in approximately 6 weeks following laparoscopic partial right colectomy for 2 adjacent carcinoid tumors of the ileocecal valve. These were both small. He's feeling well and back to normal activity. Bowels are moving regularly. No significant discomfort.  Exam: He generally appears well. Abdomen is soft and nontender wounds are well-healed without hernias.  Assessment and plan: Doing well following ileocolectomy for carcinoid tumors. I do not believe he needs any specific followup. He understands he could call at any time for concerns or questions.

## 2014-06-16 ENCOUNTER — Other Ambulatory Visit: Payer: Self-pay | Admitting: Gastroenterology

## 2014-06-16 DIAGNOSIS — R1310 Dysphagia, unspecified: Secondary | ICD-10-CM

## 2014-07-08 ENCOUNTER — Ambulatory Visit
Admission: RE | Admit: 2014-07-08 | Discharge: 2014-07-08 | Disposition: A | Discharge status: Home / Self Care | Payer: BC Managed Care – PPO | Source: Ambulatory Visit | Attending: Gastroenterology | Admitting: Gastroenterology

## 2014-07-08 DIAGNOSIS — R1310 Dysphagia, unspecified: Secondary | ICD-10-CM

## 2016-01-18 ENCOUNTER — Other Ambulatory Visit: Payer: Self-pay | Admitting: Gastroenterology

## 2019-03-17 ENCOUNTER — Encounter (HOSPITAL_BASED_OUTPATIENT_CLINIC_OR_DEPARTMENT_OTHER): Payer: Self-pay

## 2019-03-17 ENCOUNTER — Ambulatory Visit (HOSPITAL_COMMUNITY): Admission: EM | Admit: 2019-03-17 | Discharge: 2019-03-17 | Discharge status: Against Medical Advice | Payer: Self-pay

## 2019-03-17 ENCOUNTER — Emergency Department (HOSPITAL_BASED_OUTPATIENT_CLINIC_OR_DEPARTMENT_OTHER)
Admission: EM | Admit: 2019-03-17 | Discharge: 2019-03-17 | Disposition: A | Discharge status: Home / Self Care | Payer: BC Managed Care – PPO | Attending: Emergency Medicine | Admitting: Emergency Medicine

## 2019-03-17 ENCOUNTER — Other Ambulatory Visit: Payer: Self-pay

## 2019-03-17 DIAGNOSIS — I10 Essential (primary) hypertension: Secondary | ICD-10-CM | POA: Insufficient documentation

## 2019-03-17 DIAGNOSIS — Z203 Contact with and (suspected) exposure to rabies: Secondary | ICD-10-CM | POA: Diagnosis not present

## 2019-03-17 DIAGNOSIS — Z23 Encounter for immunization: Secondary | ICD-10-CM | POA: Diagnosis not present

## 2019-03-17 DIAGNOSIS — Z87891 Personal history of nicotine dependence: Secondary | ICD-10-CM | POA: Insufficient documentation

## 2019-03-17 MED ORDER — RABIES VACCINE, PCEC IM SUSR
1.0000 mL | Freq: Once | INTRAMUSCULAR | Status: AC
  Administered 2019-03-17: 1 mL via INTRAMUSCULAR
  Filled 2019-03-17 (×2): qty 1

## 2019-03-17 MED ORDER — RABIES IMMUNE GLOBULIN 150 UNIT/ML IM INJ
20.0000 [IU]/kg | INJECTION | Freq: Once | INTRAMUSCULAR | Status: AC
Start: 2019-03-17 — End: 2019-03-17
  Administered 2019-03-17: 1500 [IU] via INTRAMUSCULAR
  Filled 2019-03-17: qty 10

## 2019-03-17 NOTE — ED Provider Notes (Signed)
[facility] EMERGENCY DEPARTMENT Provider Note   CSN: [id number] Arrival date & time: 03/17/19  1710    History   Chief Complaint Chief Complaint  Patient presents with  . Animal Bite    HPI [name] is a 63 y.o. male with past medical history of carcinoid tumor of cecum status post lap right colectomy, hypertension, hyperlipidemia, who presents today for evaluation after a possible bat encounter.  He reports that last night he felt something hit his right shoulder and he brushed it off.  This happened at night when he was walking outside.  He then felt it again on his neck and heard the sound of wings flapping in his left ear.  He reports that after this he noticed a scratch on his right posterior shoulder.  He is concerned as he thinks that this may have been a bat, says that it was bigger than a large insect.  He reports his last tetanus shot was within the past 10 years.  He has never had rabies vaccines before.     HPI  Past Medical History:  Diagnosis Date  . 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 Hyperlipidemia   . Hypertension    PT STATES HE HAS TAKEN MEDICATION IN THE PAST FOR HYPERTENSION-BUT WAS ABLE TO STOP MEDS WHEN B/P IMPROVED    Patient Active Problem List   Diagnosis Date Noted  . 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 Hypertension   . Hyperlipidemia     Past Surgical History:  Procedure Laterality Date  . APPENDECTOMY    . lap partial colectomy  08/08/11        Home Medications    Prior to Admission medications   Medication Sig Start Date End Date Taking? Authorizing Provider  fish oil-omega-3 fatty acids 1000 MG capsule Take 1,200 mg by mouth daily.     [provider]  psyllium (METAMUCIL) 58.6 % packet Take 1 packet by mouth daily.      [provider]  Specialty  Vitamins Products (RA CENTRAL-VITE CARDIO PO) Take 1 tablet by mouth daily.     [provider]    Family History Family History  Problem Relation Age of Onset  . Cancer Father 69       lung cancer     Social History Social History   Tobacco Use  . Smoking status: Former Smoker    Packs/day: 1.00    Years: 10.00    Pack years: 10.00    Types: Cigarettes    Quit date: 03/02/1984    Years since quitting: 35.0  . Smokeless tobacco: Never Used  Substance Use Topics  . Alcohol use: Yes    Comment: occ  . Drug use: No     Allergies   Patient has no known allergies.   Review of Systems Review of Systems  Musculoskeletal: Negative for neck pain and neck stiffness.  Skin: Positive for wound.  Neurological: Negative for headaches.  All other systems reviewed and are negative.    Physical Exam Updated Vital Signs BP (!) 155/85 (BP Location: Right Arm)   Pulse 66   Temp 98.1 F (36.7 C) (Oral)   Resp 16   Ht 5\' 8"  (1.727 m)   Wt 75.3 kg   SpO2 100%   BMI 25.24 kg/m   Physical Exam Vitals signs and nursing  note reviewed.  Constitutional:      General: He is not in acute distress.    Appearance: He is well-developed. He is not diaphoretic.  HENT:     Head: Normocephalic and atraumatic.  Eyes:     General: No scleral icterus.       Right eye: No discharge.        Left eye: No discharge.     Conjunctiva/sclera: Conjunctivae normal.  Neck:     Musculoskeletal: Normal range of motion. No neck rigidity.  Cardiovascular:     Rate and Rhythm: Normal rate and regular rhythm.  Pulmonary:     Effort: Pulmonary effort is normal. No respiratory distress.     Breath sounds: No stridor.  Musculoskeletal:        General: No deformity.  Skin:    General: Skin is warm and dry.     Comments: Please see clinical image.  There is a superficial, 1 cm scratch on the right posterior shoulder.  No abnormal surrounding erythema.  No active oozing or bleeding.   Neurological:     Mental Status: He is alert.     Motor: No abnormal muscle tone.  Psychiatric:        Behavior: Behavior normal.        ED Treatments / Results  Labs (all labs ordered are listed, but only abnormal results are displayed) Labs Reviewed - No data to display  EKG None  Radiology No results found.  Procedures Procedures (including critical care time)  Medications Ordered in ED Medications  rabies immune globulin (HYPERAB/KEDRAB) injection 1,500 Units (1,500 Units Intramuscular Given 03/17/19 2036)  rabies vaccine (RABAVERT) injection 1 mL (1 mL Intramuscular Given 03/17/19 2034)     Initial Impression / Assessment and Plan / ED Course  I have reviewed the triage vital signs and the nursing notes.  Pertinent labs & imaging results that were available during my care of the patient were reviewed by me and considered in my medical decision making (see chart for details).       Patient presents today for evaluation after a possible bat exposure.  He has a scratch on his right posterior shoulder where he felt what he believes was a bat landed on his shoulder last night.  We discussed rabies exposure, the course and nearly universally fatal outcome of rabies.  He states his last tetanus shot was within the past 10 years.  Rabies postexposure prophylaxis was discussed including risks and benefits, and patient accepts.  He is given rabies vaccine and rabies immunoglobulin.  He is given instructions for obtaining additional rabies vaccines.  No evidence of secondary infection on the side of the possible exposure.  Return precautions were discussed with patient who states their understanding.  At the time of discharge patient denied any unaddressed complaints or concerns.  Patient is agreeable for discharge home.    Final Clinical Impressions(s) / ED Diagnoses   Final diagnoses:  Need for post exposure prophylaxis for rabies    ED Discharge Orders    None        Ollen Gross 03/17/19 2237    Malvin Johns, MD 03/17/19 2245

## 2019-03-17 NOTE — ED Notes (Signed)
Pt. Felt something on his R shoulder and then after he brushed it off he felt it again at his neck and heard the wing flap sound at the L ear then after his shower last pm he saw a place on the R shoulder where he felt the initial latch of the first encounter.

## 2019-03-17 NOTE — ED Triage Notes (Signed)
Pt thinks a bat bit him right posterior shoulder last night-NAD-steady gait

## 2019-03-17 NOTE — ED Notes (Signed)
Patient verbalizes understanding of discharge instructions. Opportunity for questioning and answers were provided. Armband removed by staff, pt discharged from ED.  

## 2019-03-17 NOTE — Discharge Instructions (Signed)
Please follow the dates on the letter you were given to get your additional doses of rabies vaccine.  If you find out that your tetanus shot has been more than 10 years, then you need a booster shot.

## 2019-03-20 ENCOUNTER — Ambulatory Visit (HOSPITAL_COMMUNITY)
Admission: EM | Admit: 2019-03-20 | Discharge: 2019-03-20 | Disposition: A | Discharge status: Home / Self Care | Payer: BC Managed Care – PPO | Attending: Internal Medicine | Admitting: Internal Medicine

## 2019-03-20 ENCOUNTER — Other Ambulatory Visit: Payer: Self-pay

## 2019-03-20 DIAGNOSIS — Z203 Contact with and (suspected) exposure to rabies: Secondary | ICD-10-CM | POA: Diagnosis not present

## 2019-03-20 DIAGNOSIS — Z23 Encounter for immunization: Secondary | ICD-10-CM | POA: Diagnosis not present

## 2019-03-20 MED ORDER — RABIES VACCINE, PCEC IM SUSR
INTRAMUSCULAR | Status: AC
  Filled 2019-03-20: qty 1

## 2019-03-20 MED ORDER — RABIES VACCINE, PCEC IM SUSR
1.0000 mL | Freq: Once | INTRAMUSCULAR | Status: AC
  Administered 2019-03-20: 1 mL via INTRAMUSCULAR

## 2019-03-20 NOTE — ED Triage Notes (Signed)
Pt p resents to get second rabies injection.

## 2019-03-24 ENCOUNTER — Other Ambulatory Visit: Payer: Self-pay

## 2019-03-24 ENCOUNTER — Ambulatory Visit (HOSPITAL_COMMUNITY)
Admission: EM | Admit: 2019-03-24 | Discharge: 2019-03-24 | Disposition: A | Discharge status: Home / Self Care | Payer: BC Managed Care – PPO | Attending: Internal Medicine | Admitting: Internal Medicine

## 2019-03-24 DIAGNOSIS — Z203 Contact with and (suspected) exposure to rabies: Secondary | ICD-10-CM | POA: Diagnosis not present

## 2019-03-24 DIAGNOSIS — Z23 Encounter for immunization: Secondary | ICD-10-CM

## 2019-03-24 MED ORDER — RABIES VACCINE, PCEC IM SUSR
INTRAMUSCULAR | Status: AC
  Filled 2019-03-24: qty 1

## 2019-03-24 MED ORDER — RABIES VACCINE, PCEC IM SUSR
1.0000 mL | Freq: Once | INTRAMUSCULAR | Status: AC
  Administered 2019-03-24: 09:00:00 1 mL via INTRAMUSCULAR

## 2019-03-24 NOTE — ED Triage Notes (Signed)
Pt presents for 3rd rabies injection. 

## 2019-03-31 ENCOUNTER — Ambulatory Visit (HOSPITAL_COMMUNITY)
Admission: EM | Admit: 2019-03-31 | Discharge: 2019-03-31 | Disposition: A | Discharge status: Home / Self Care | Payer: BC Managed Care – PPO | Attending: Family Medicine | Admitting: Family Medicine

## 2019-03-31 ENCOUNTER — Other Ambulatory Visit: Payer: Self-pay

## 2019-03-31 DIAGNOSIS — Z203 Contact with and (suspected) exposure to rabies: Secondary | ICD-10-CM

## 2019-03-31 DIAGNOSIS — Z23 Encounter for immunization: Secondary | ICD-10-CM

## 2019-03-31 MED ORDER — RABIES VACCINE, PCEC IM SUSR
INTRAMUSCULAR | Status: AC
  Filled 2019-03-31: qty 1

## 2019-03-31 MED ORDER — RABIES VACCINE, PCEC IM SUSR: 1.0000 mL | Freq: Once | INTRAMUSCULAR | Status: DC

## 2019-03-31 NOTE — ED Triage Notes (Signed)
Pt presents for day 7 rabies injection. Denies any other complaints.

## 2019-10-19 ENCOUNTER — Ambulatory Visit: Payer: Medicare Other | Attending: Internal Medicine

## 2019-10-19 DIAGNOSIS — Z23 Encounter for immunization: Secondary | ICD-10-CM | POA: Insufficient documentation

## 2019-10-19 NOTE — Progress Notes (Signed)
   Covid-19 Vaccination Clinic  Name:  [name]    MRN: [id number] DOB: 1956-05-29  10/19/2019  [name] was observed post Covid-19 immunization for 15 minutes without incidence. He was provided with Vaccine Information Sheet and instruction to access the V-Safe system.   [name] was instructed to call 911 with any severe reactions post vaccine: Marland Kitchen Difficulty breathing  . Swelling of your face and throat  . A fast heartbeat  . A bad rash all over your body  . Dizziness and weakness    Immunizations Administered    Name Date Dose VIS Date Route   Pfizer COVID-19 Vaccine 10/19/2019 11:19 AM 0.3 mL 08/08/2019 Intramuscular   Manufacturer: [place]   Lot: [id number]   [place]: [id number]

## 2019-11-12 ENCOUNTER — Ambulatory Visit: Payer: Medicare Other | Attending: Internal Medicine

## 2019-11-12 DIAGNOSIS — Z23 Encounter for immunization: Secondary | ICD-10-CM

## 2019-11-12 NOTE — Progress Notes (Signed)
   Covid-19 Vaccination Clinic  Name:  [name]    MRN: [id number] DOB: 04-24-1956  11/12/2019  [name] was observed post Covid-19 immunization for 15 minutes without incident. He was provided with Vaccine Information Sheet and instruction to access the V-Safe system.   [name] was instructed to call 911 with any severe reactions post vaccine: Marland Kitchen Difficulty breathing  . Swelling of face and throat  . A fast heartbeat  . A bad rash all over body  . Dizziness and weakness   Immunizations Administered    Name Date Dose VIS Date Route   Pfizer COVID-19 Vaccine 11/12/2019  9:20 AM 0.3 mL 08/08/2019 Intramuscular   Manufacturer: [place]   Lot: [id number]   [place]: [id number]

## 2019-12-30 DIAGNOSIS — Z125 Encounter for screening for malignant neoplasm of prostate: Secondary | ICD-10-CM | POA: Diagnosis not present

## 2019-12-30 DIAGNOSIS — I10 Essential (primary) hypertension: Secondary | ICD-10-CM | POA: Diagnosis not present

## 2019-12-30 DIAGNOSIS — E782 Mixed hyperlipidemia: Secondary | ICD-10-CM | POA: Diagnosis not present

## 2019-12-30 DIAGNOSIS — D696 Thrombocytopenia, unspecified: Secondary | ICD-10-CM | POA: Diagnosis not present

## 2020-01-01 DIAGNOSIS — I10 Essential (primary) hypertension: Secondary | ICD-10-CM | POA: Diagnosis not present

## 2020-01-01 DIAGNOSIS — D696 Thrombocytopenia, unspecified: Secondary | ICD-10-CM | POA: Diagnosis not present

## 2020-01-01 DIAGNOSIS — E782 Mixed hyperlipidemia: Secondary | ICD-10-CM | POA: Diagnosis not present

## 2020-01-01 DIAGNOSIS — Z0000 Encounter for general adult medical examination without abnormal findings: Secondary | ICD-10-CM | POA: Diagnosis not present

## 2020-01-05 ENCOUNTER — Other Ambulatory Visit: Payer: Self-pay | Admitting: Family Medicine

## 2020-01-05 DIAGNOSIS — Z87891 Personal history of nicotine dependence: Secondary | ICD-10-CM

## 2020-01-09 ENCOUNTER — Ambulatory Visit
Admission: RE | Admit: 2020-01-09 | Discharge: 2020-01-09 | Disposition: A | Discharge status: Home / Self Care | Payer: Medicare Other | Source: Ambulatory Visit | Attending: Family Medicine | Admitting: Family Medicine

## 2020-01-09 DIAGNOSIS — Z87891 Personal history of nicotine dependence: Secondary | ICD-10-CM | POA: Diagnosis not present

## 2020-01-09 DIAGNOSIS — Z136 Encounter for screening for cardiovascular disorders: Secondary | ICD-10-CM | POA: Diagnosis not present

## 2020-01-29 DIAGNOSIS — H10413 Chronic giant papillary conjunctivitis, bilateral: Secondary | ICD-10-CM | POA: Diagnosis not present

## 2020-02-15 DIAGNOSIS — J329 Chronic sinusitis, unspecified: Secondary | ICD-10-CM | POA: Diagnosis not present

## 2020-02-19 DIAGNOSIS — Z03818 Encounter for observation for suspected exposure to other biological agents ruled out: Secondary | ICD-10-CM | POA: Diagnosis not present

## 2020-02-19 DIAGNOSIS — Z20822 Contact with and (suspected) exposure to covid-19: Secondary | ICD-10-CM | POA: Diagnosis not present

## 2020-04-16 DIAGNOSIS — D485 Neoplasm of uncertain behavior of skin: Secondary | ICD-10-CM | POA: Diagnosis not present

## 2020-04-16 DIAGNOSIS — D2271 Melanocytic nevi of right lower limb, including hip: Secondary | ICD-10-CM | POA: Diagnosis not present

## 2020-04-16 DIAGNOSIS — D225 Melanocytic nevi of trunk: Secondary | ICD-10-CM | POA: Diagnosis not present

## 2020-04-16 DIAGNOSIS — Z1283 Encounter for screening for malignant neoplasm of skin: Secondary | ICD-10-CM | POA: Diagnosis not present

## 2020-04-16 DIAGNOSIS — L304 Erythema intertrigo: Secondary | ICD-10-CM | POA: Diagnosis not present

## 2020-06-25 DIAGNOSIS — L304 Erythema intertrigo: Secondary | ICD-10-CM | POA: Diagnosis not present

## 2020-06-25 DIAGNOSIS — B078 Other viral warts: Secondary | ICD-10-CM | POA: Diagnosis not present

## 2020-10-19 DIAGNOSIS — S80819A Abrasion, unspecified lower leg, initial encounter: Secondary | ICD-10-CM | POA: Diagnosis not present

## 2020-12-16 DIAGNOSIS — L239 Allergic contact dermatitis, unspecified cause: Secondary | ICD-10-CM | POA: Diagnosis not present

## 2021-01-05 DIAGNOSIS — Z0000 Encounter for general adult medical examination without abnormal findings: Secondary | ICD-10-CM | POA: Diagnosis not present

## 2021-01-05 DIAGNOSIS — D696 Thrombocytopenia, unspecified: Secondary | ICD-10-CM | POA: Diagnosis not present

## 2021-01-05 DIAGNOSIS — E782 Mixed hyperlipidemia: Secondary | ICD-10-CM | POA: Diagnosis not present

## 2021-01-05 DIAGNOSIS — I10 Essential (primary) hypertension: Secondary | ICD-10-CM | POA: Diagnosis not present

## 2021-01-12 DIAGNOSIS — R3911 Hesitancy of micturition: Secondary | ICD-10-CM | POA: Diagnosis not present

## 2021-01-12 DIAGNOSIS — M19049 Primary osteoarthritis, unspecified hand: Secondary | ICD-10-CM | POA: Diagnosis not present

## 2021-01-12 DIAGNOSIS — I10 Essential (primary) hypertension: Secondary | ICD-10-CM | POA: Diagnosis not present

## 2021-01-12 DIAGNOSIS — Z0000 Encounter for general adult medical examination without abnormal findings: Secondary | ICD-10-CM | POA: Diagnosis not present

## 2021-03-31 DIAGNOSIS — L600 Ingrowing nail: Secondary | ICD-10-CM | POA: Diagnosis not present

## 2021-03-31 DIAGNOSIS — B351 Tinea unguium: Secondary | ICD-10-CM | POA: Diagnosis not present

## 2021-04-07 ENCOUNTER — Ambulatory Visit: Payer: Medicare Other | Admitting: Podiatry

## 2021-04-07 ENCOUNTER — Other Ambulatory Visit: Payer: Self-pay

## 2021-04-07 ENCOUNTER — Encounter: Payer: Self-pay | Admitting: Podiatry

## 2021-04-07 DIAGNOSIS — L601 Onycholysis: Secondary | ICD-10-CM | POA: Diagnosis not present

## 2021-04-07 DIAGNOSIS — L603 Nail dystrophy: Secondary | ICD-10-CM

## 2021-04-07 DIAGNOSIS — L608 Other nail disorders: Secondary | ICD-10-CM | POA: Diagnosis not present

## 2021-04-08 NOTE — Progress Notes (Signed)
  Subjective:  Patient ID: [name], male    DOB: 16-Apr-1956,  MRN: [id number] HPI Chief Complaint  Patient presents with   Nail Problem    Hallux nails bilateral - LEFT - medial border has been red and tender, PCP Rx'd mupirocin-better, RIGHT - thick and discolored, soaking both in epsom and tea tree oil   New Patient (Initial Visit)    65 y.o. male presents with the above complaint.   ROS: Denies fever chills nausea vomiting muscle aches pains calf pain back pain chest pain shortness of breath.  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 , Disp: , Rfl:    irbesartan (AVAPRO) 300 MG tablet, Take 300 mg by mouth daily., Disp: , Rfl:    mupirocin ointment (BACTROBAN) 2 %, Apply topically 2 (two) times daily., Disp: , Rfl:    psyllium (METAMUCIL) 58.6 % packet, Take 1 packet by mouth daily.  , Disp: , Rfl:    Specialty Vitamins Products (RA CENTRAL-VITE CARDIO PO), Take 1 tablet by mouth daily. , Disp: , Rfl:   No Known Allergies Review of Systems Objective:  There were no vitals filed for this visit.  General: Well developed, nourished, in no acute distress, alert and oriented x3   Dermatological: Skin is warm, dry and supple bilateral. Nails x 10 are well maintained; remaining integument appears unremarkable at this time. There are no open sores, no preulcerative lesions, no rash or signs of infection present.  Nail dystrophy hallux bilateral foot more concerned about the  left medial side which does not appear to be ingrowing at this point also the right toenail which is thickened discolored and somewhat tender.  Vascular: Dorsalis Pedis artery and Posterior Tibial artery pedal pulses are 2/4 bilateral with immedate capillary fill time. Pedal hair growth present. No varicosities and no lower extremity edema present bilateral.   Neruologic: Grossly intact via light touch bilateral. Vibratory intact via tuning fork bilateral. Protective threshold with Semmes Wienstein monofilament intact to all pedal sites bilateral. Patellar and Achilles deep tendon reflexes 2+ bilateral. No Babinski or clonus noted bilateral.   Musculoskeletal: No gross boney pedal deformities bilateral. No pain, crepitus, or limitation noted with foot and ankle range of motion bilateral. Muscular strength 5/5 in all groups tested bilateral.  Gait: Unassisted, Nonantalgic.    Radiographs:  None taken  Assessment & Plan:   Assessment: Nail dystrophy  Plan: Samples of the skin and nail were taken today to be sent for pathologic evaluation I will follow-up with him in 1 month for results.     Magdelene Ruark T. [place], [place]

## 2021-05-04 ENCOUNTER — Telehealth: Payer: Self-pay | Admitting: Podiatry

## 2021-05-04 ENCOUNTER — Telehealth: Payer: Self-pay | Admitting: *Deleted

## 2021-05-04 NOTE — Telephone Encounter (Signed)
Called patient (L/M) - let him know pathology showed a nail dystrophy and he could cancel his next appointment that he had scheduled.

## 2021-05-04 NOTE — Telephone Encounter (Signed)
-----   Message from Garrel [place], [place] sent at 04/21/2021  7:11 AM EDT ----- Let him know his is negative for fungus and nail changes are due to nail dystrophy ie nail root damage.  Thanks

## 2021-05-04 NOTE — Telephone Encounter (Signed)
[name] called the office wanting to cancel his upcoming appointment , he stated he wanted to talk with you , that he has a few questions.

## 2021-05-10 ENCOUNTER — Ambulatory Visit: Payer: Medicare Other | Admitting: Podiatry

## 2021-05-19 ENCOUNTER — Ambulatory Visit: Payer: Medicare Other | Admitting: Podiatry

## 2021-08-30 DIAGNOSIS — U071 COVID-19: Secondary | ICD-10-CM | POA: Diagnosis not present

## 2021-09-11 DIAGNOSIS — U071 COVID-19: Secondary | ICD-10-CM | POA: Diagnosis not present

## 2022-01-12 DIAGNOSIS — D696 Thrombocytopenia, unspecified: Secondary | ICD-10-CM | POA: Diagnosis not present

## 2022-01-12 DIAGNOSIS — I10 Essential (primary) hypertension: Secondary | ICD-10-CM | POA: Diagnosis not present

## 2022-01-12 DIAGNOSIS — E782 Mixed hyperlipidemia: Secondary | ICD-10-CM | POA: Diagnosis not present

## 2022-01-12 DIAGNOSIS — Z125 Encounter for screening for malignant neoplasm of prostate: Secondary | ICD-10-CM | POA: Diagnosis not present

## 2022-01-16 DIAGNOSIS — E782 Mixed hyperlipidemia: Secondary | ICD-10-CM | POA: Diagnosis not present

## 2022-01-16 DIAGNOSIS — Z0000 Encounter for general adult medical examination without abnormal findings: Secondary | ICD-10-CM | POA: Diagnosis not present

## 2022-01-16 DIAGNOSIS — Z125 Encounter for screening for malignant neoplasm of prostate: Secondary | ICD-10-CM | POA: Diagnosis not present

## 2022-01-16 DIAGNOSIS — I10 Essential (primary) hypertension: Secondary | ICD-10-CM | POA: Diagnosis not present

## 2022-03-06 IMAGING — US US ABDOMINAL AORTA SCREENING AAA
1 series · 14 of 19 positions shown · non-contrast
Comparison: None.

CLINICAL DATA: Male between 65-75 years of age with a smoking
history.

EXAM:
US ABDOMINAL AORTA MEDICARE SCREENING
TECHNIQUE: Ultrasound examination of the abdominal aorta was performed as a
screening evaluation for abdominal aortic aneurysm.

[Series 1: us abdominal aorta screening aaa · 0.30mm/px · 14 of 19 slices shown]
[im 1/19]
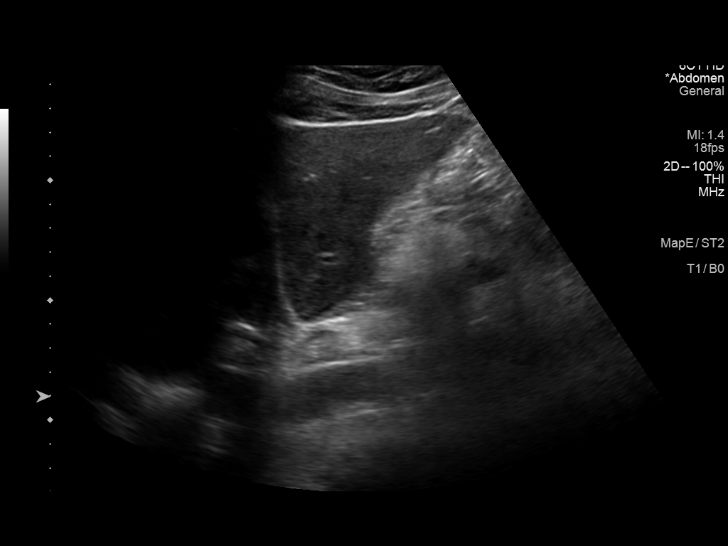
[im 3/19]
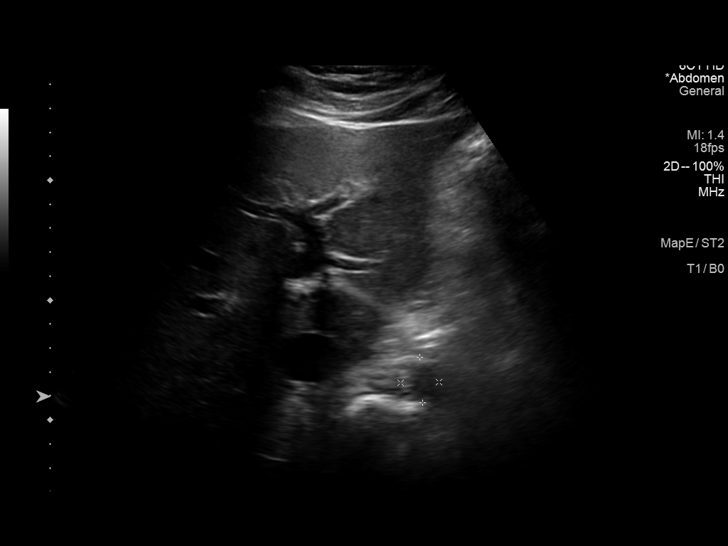
[im 4/19]
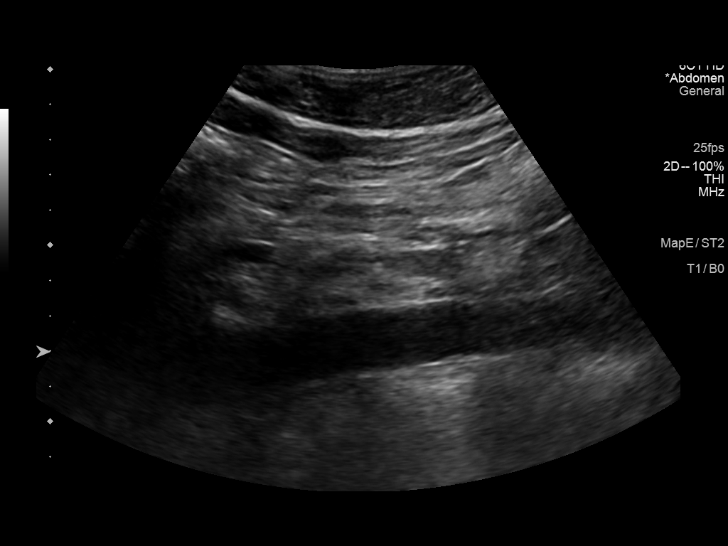
[im 5/19]
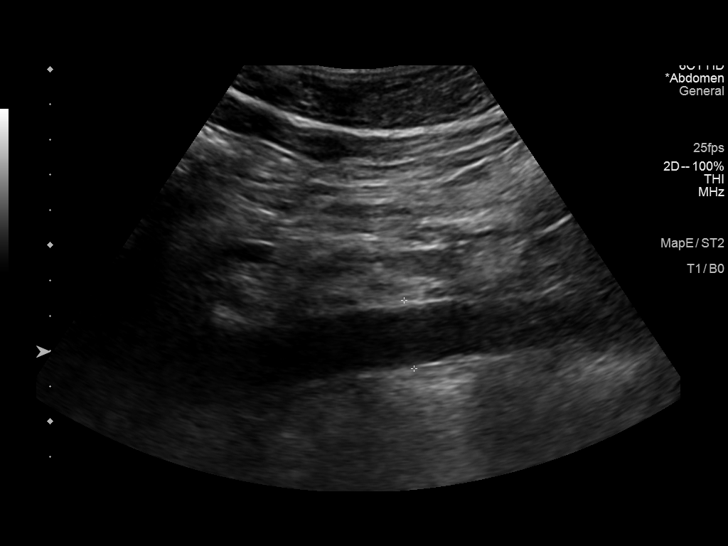
[im 7/19]
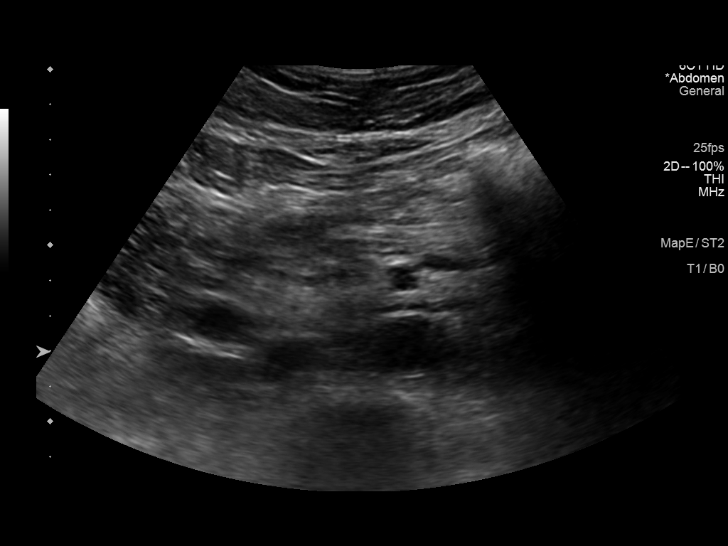
[im 8/19]
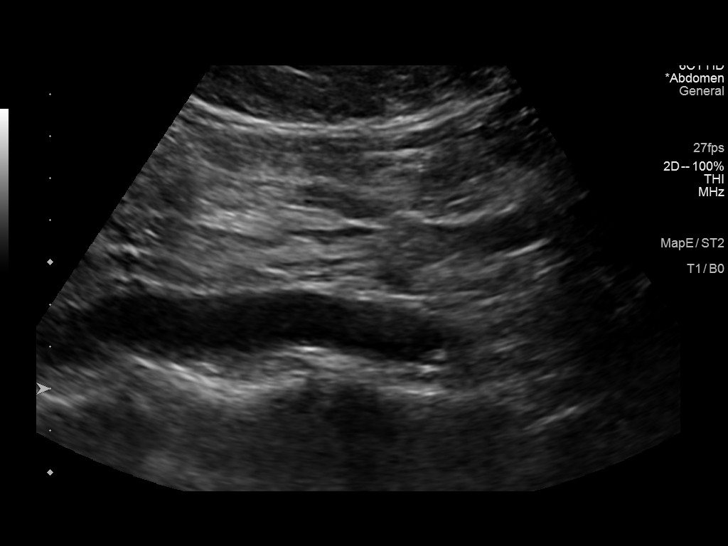
[im 9/19]
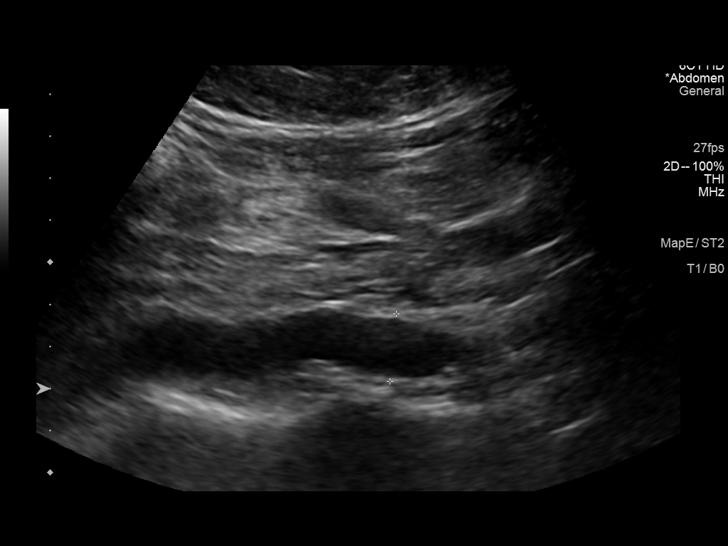
[im 11/19]
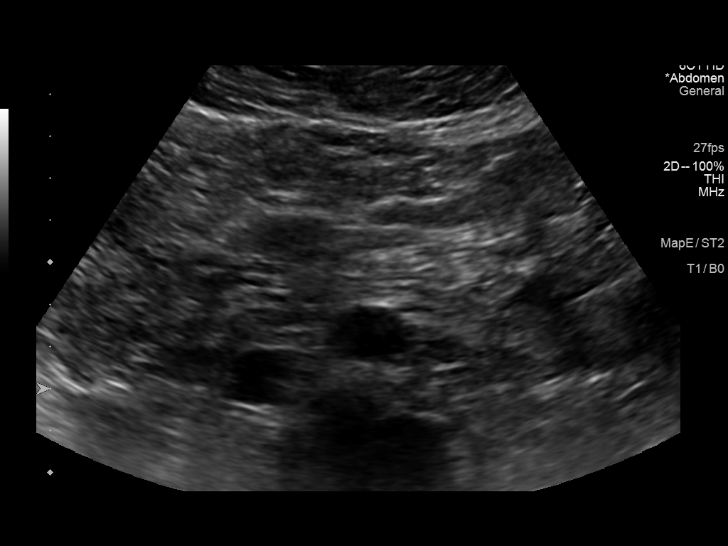
[im 12/19]
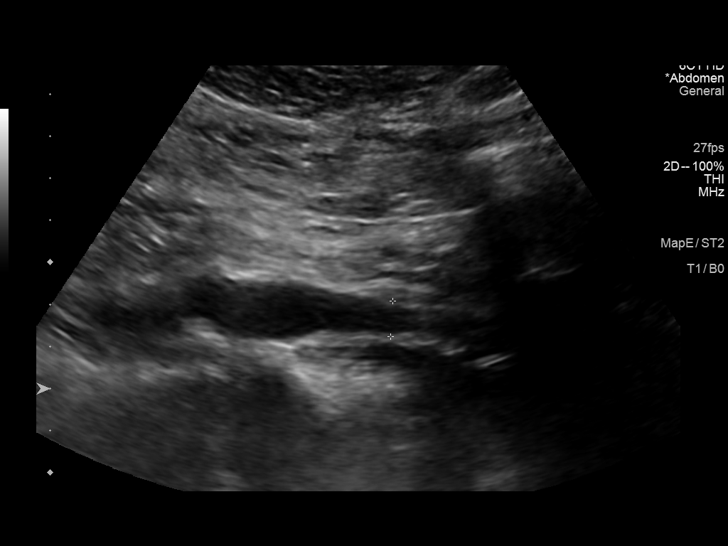
[im 13/19]
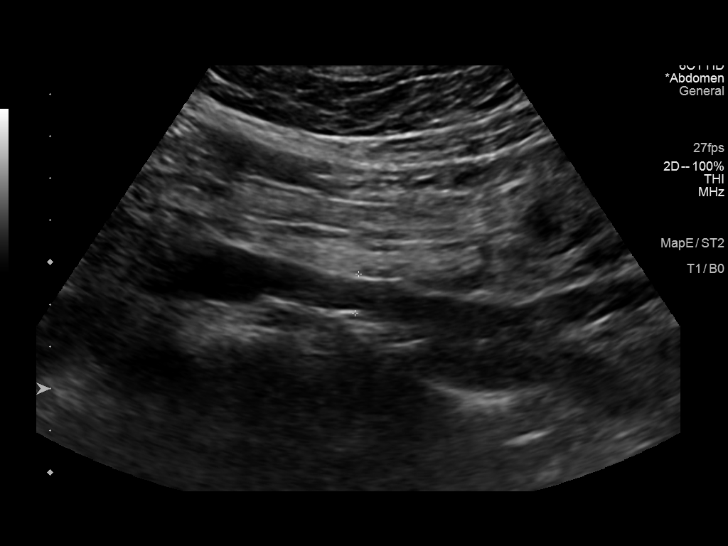
[im 15/19]
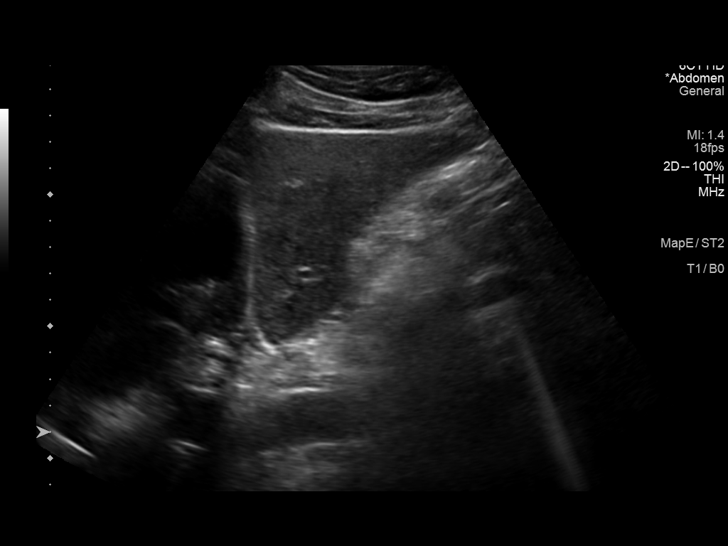
[im 16/19]
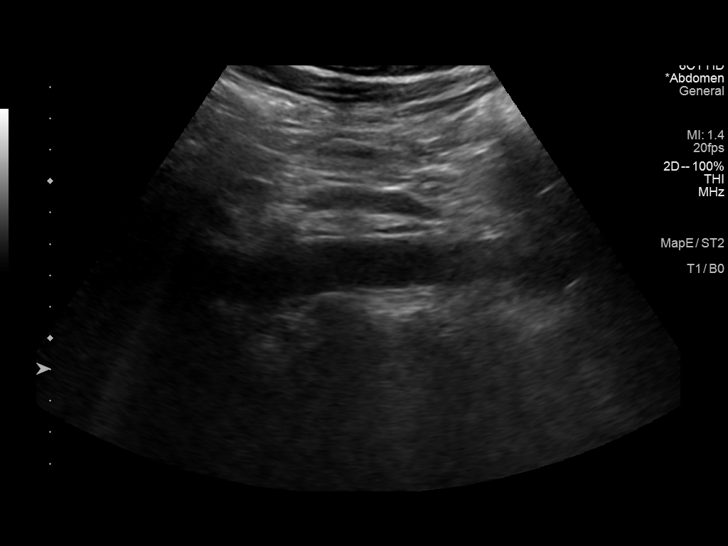
[im 17/19]
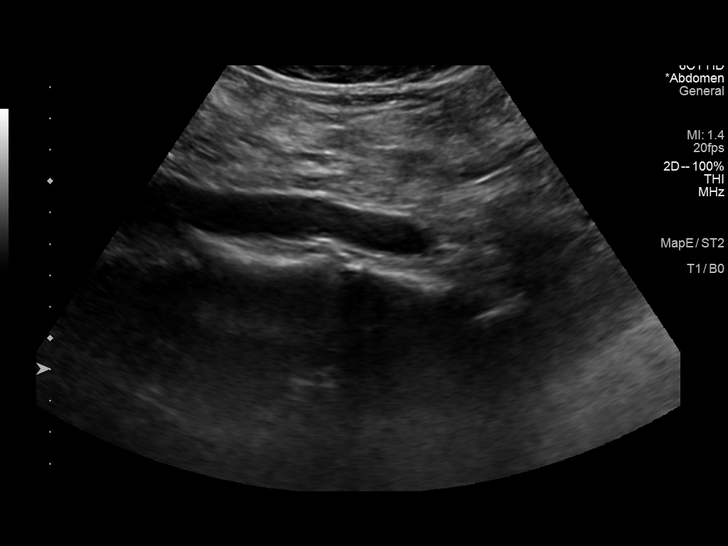
[im 19/19]
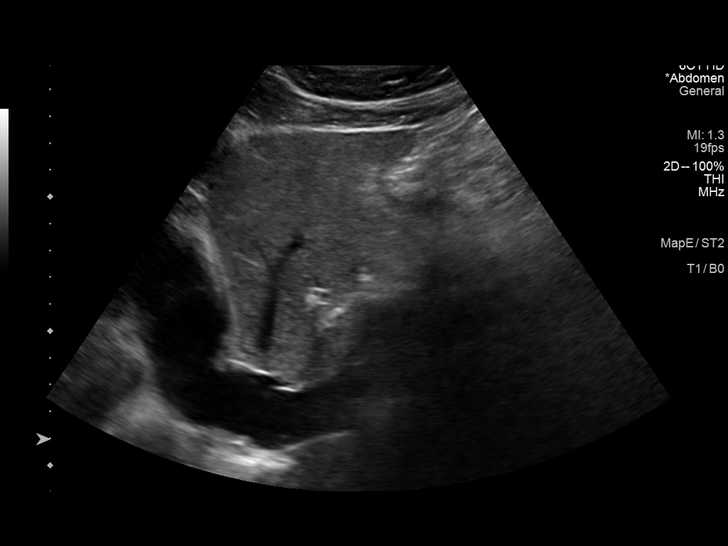

[14 of 19 positions shown; findings below may reference images not displayed]

FINDINGS: Abdominal aortic measurements as follows:

Proximal:  2.3 cm

Mid:  2.0 cm

Distal:  1.6 cm
IMPRESSION: No evidence of abdominal aortic aneurysm.

## 2022-06-13 DIAGNOSIS — H43812 Vitreous degeneration, left eye: Secondary | ICD-10-CM | POA: Diagnosis not present

## 2022-06-13 DIAGNOSIS — H33302 Unspecified retinal break, left eye: Secondary | ICD-10-CM | POA: Diagnosis not present

## 2022-06-13 DIAGNOSIS — H524 Presbyopia: Secondary | ICD-10-CM | POA: Diagnosis not present

## 2022-06-19 DIAGNOSIS — H3589 Other specified retinal disorders: Secondary | ICD-10-CM | POA: Diagnosis not present

## 2022-06-19 DIAGNOSIS — H53452 Other localized visual field defect, left eye: Secondary | ICD-10-CM | POA: Diagnosis not present

## 2022-06-19 DIAGNOSIS — H43392 Other vitreous opacities, left eye: Secondary | ICD-10-CM | POA: Diagnosis not present

## 2022-06-19 DIAGNOSIS — H43812 Vitreous degeneration, left eye: Secondary | ICD-10-CM | POA: Diagnosis not present

## 2022-07-07 DIAGNOSIS — R051 Acute cough: Secondary | ICD-10-CM | POA: Diagnosis not present

## 2022-07-07 DIAGNOSIS — R070 Pain in throat: Secondary | ICD-10-CM | POA: Diagnosis not present

## 2022-07-07 DIAGNOSIS — R509 Fever, unspecified: Secondary | ICD-10-CM | POA: Diagnosis not present

## 2022-07-17 ENCOUNTER — Other Ambulatory Visit: Payer: Self-pay | Admitting: Ophthalmology

## 2022-07-17 DIAGNOSIS — D23111 Other benign neoplasm of skin of right upper eyelid, including canthus: Secondary | ICD-10-CM | POA: Diagnosis not present

## 2022-07-17 DIAGNOSIS — L918 Other hypertrophic disorders of the skin: Secondary | ICD-10-CM | POA: Diagnosis not present

## 2022-07-17 DIAGNOSIS — D485 Neoplasm of uncertain behavior of skin: Secondary | ICD-10-CM | POA: Diagnosis not present

## 2022-09-27 DIAGNOSIS — H3323 Serous retinal detachment, bilateral: Secondary | ICD-10-CM | POA: Diagnosis not present

## 2022-09-27 DIAGNOSIS — H53452 Other localized visual field defect, left eye: Secondary | ICD-10-CM | POA: Diagnosis not present

## 2022-09-27 DIAGNOSIS — H3589 Other specified retinal disorders: Secondary | ICD-10-CM | POA: Diagnosis not present

## 2022-09-27 DIAGNOSIS — H35371 Puckering of macula, right eye: Secondary | ICD-10-CM | POA: Diagnosis not present

## 2022-09-27 DIAGNOSIS — H33103 Unspecified retinoschisis, bilateral: Secondary | ICD-10-CM | POA: Diagnosis not present

## 2022-10-10 DIAGNOSIS — K080 Exfoliation of teeth due to systemic causes: Secondary | ICD-10-CM | POA: Diagnosis not present

## 2022-11-10 DIAGNOSIS — D123 Benign neoplasm of transverse colon: Secondary | ICD-10-CM | POA: Diagnosis not present

## 2022-11-10 DIAGNOSIS — Z980 Intestinal bypass and anastomosis status: Secondary | ICD-10-CM | POA: Diagnosis not present

## 2022-11-10 DIAGNOSIS — C7A029 Malignant carcinoid tumor of the large intestine, unspecified portion: Secondary | ICD-10-CM | POA: Diagnosis not present

## 2022-11-14 DIAGNOSIS — D123 Benign neoplasm of transverse colon: Secondary | ICD-10-CM | POA: Diagnosis not present

## 2023-01-18 DIAGNOSIS — Z125 Encounter for screening for malignant neoplasm of prostate: Secondary | ICD-10-CM | POA: Diagnosis not present

## 2023-01-18 DIAGNOSIS — I10 Essential (primary) hypertension: Secondary | ICD-10-CM | POA: Diagnosis not present

## 2023-01-18 DIAGNOSIS — D696 Thrombocytopenia, unspecified: Secondary | ICD-10-CM | POA: Diagnosis not present

## 2023-01-18 DIAGNOSIS — E782 Mixed hyperlipidemia: Secondary | ICD-10-CM | POA: Diagnosis not present

## 2023-01-25 DIAGNOSIS — Z0000 Encounter for general adult medical examination without abnormal findings: Secondary | ICD-10-CM | POA: Diagnosis not present

## 2023-01-25 DIAGNOSIS — D696 Thrombocytopenia, unspecified: Secondary | ICD-10-CM | POA: Diagnosis not present

## 2023-01-25 DIAGNOSIS — I10 Essential (primary) hypertension: Secondary | ICD-10-CM | POA: Diagnosis not present

## 2023-01-25 DIAGNOSIS — R3911 Hesitancy of micturition: Secondary | ICD-10-CM | POA: Diagnosis not present

## 2023-01-29 DIAGNOSIS — H01001 Unspecified blepharitis right upper eyelid: Secondary | ICD-10-CM | POA: Diagnosis not present

## 2023-01-29 DIAGNOSIS — H01004 Unspecified blepharitis left upper eyelid: Secondary | ICD-10-CM | POA: Diagnosis not present

## 2023-03-28 DIAGNOSIS — H33011 Retinal detachment with single break, right eye: Secondary | ICD-10-CM | POA: Diagnosis not present

## 2023-03-28 DIAGNOSIS — H43392 Other vitreous opacities, left eye: Secondary | ICD-10-CM | POA: Diagnosis not present

## 2023-03-28 DIAGNOSIS — H35371 Puckering of macula, right eye: Secondary | ICD-10-CM | POA: Diagnosis not present

## 2023-03-28 DIAGNOSIS — H33103 Unspecified retinoschisis, bilateral: Secondary | ICD-10-CM | POA: Diagnosis not present

## 2023-03-28 DIAGNOSIS — H3589 Other specified retinal disorders: Secondary | ICD-10-CM | POA: Diagnosis not present

## 2023-04-26 DIAGNOSIS — H33011 Retinal detachment with single break, right eye: Secondary | ICD-10-CM | POA: Diagnosis not present

## 2023-05-10 DIAGNOSIS — H33012 Retinal detachment with single break, left eye: Secondary | ICD-10-CM | POA: Diagnosis not present

## 2023-06-22 DIAGNOSIS — H33303 Unspecified retinal break, bilateral: Secondary | ICD-10-CM | POA: Diagnosis not present

## 2023-06-25 ENCOUNTER — Other Ambulatory Visit: Payer: Self-pay

## 2023-06-25 ENCOUNTER — Encounter (HOSPITAL_COMMUNITY): Payer: Self-pay

## 2023-06-25 ENCOUNTER — Emergency Department (HOSPITAL_COMMUNITY)
Admission: EM | Admit: 2023-06-25 | Discharge: 2023-06-26 | Discharge status: Against Medical Advice | Payer: Medicare Other | Attending: Emergency Medicine | Admitting: Emergency Medicine

## 2023-06-25 DIAGNOSIS — Z5321 Procedure and treatment not carried out due to patient leaving prior to being seen by health care provider: Secondary | ICD-10-CM | POA: Diagnosis not present

## 2023-06-25 DIAGNOSIS — R079 Chest pain, unspecified: Secondary | ICD-10-CM | POA: Insufficient documentation

## 2023-06-25 LAB — COMPREHENSIVE METABOLIC PANEL
ALT: 43 U/L (ref 0–44)
AST: 35 U/L (ref 15–41)
Albumin: 4.4 g/dL (ref 3.5–5.0)
Alkaline Phosphatase: 94 U/L (ref 38–126)
Anion gap: 6 (ref 5–15)
BUN: 17 mg/dL (ref 8–23)
CO2: 24 mmol/L (ref 22–32)
Calcium: 9 mg/dL (ref 8.9–10.3)
Chloride: 112 mmol/L — ABNORMAL HIGH (ref 98–111)
Creatinine, Ser: 1.1 mg/dL (ref 0.61–1.24)
GFR, Estimated: >60 mL/min (ref >60)
Glucose, Bld: 119 mg/dL — ABNORMAL HIGH (ref 70–99)
Potassium: 3.6 mmol/L (ref 3.5–5.1)
Sodium: 142 mmol/L (ref 135–145)
Total Bilirubin: 1.1 mg/dL (ref 0.3–1.2)
Total Protein: 6.4 g/dL — ABNORMAL LOW (ref 6.5–8.1)

## 2023-06-25 LAB — CBC WITH DIFFERENTIAL/PLATELET
Abs Immature Granulocytes: 0.03 10*3/uL (ref 0.00–0.07)
Basophils Absolute: 0 10*3/uL (ref 0.0–0.1)
Basophils Relative: 1 %
Eosinophils Absolute: 0 10*3/uL (ref 0.0–0.5)
Eosinophils Relative: 0 %
HCT: 49.7 % (ref 39.0–52.0)
Hemoglobin: 16.3 g/dL (ref 13.0–17.0)
Immature Granulocytes: 0 %
Lymphocytes Relative: 15 %
Lymphs Abs: 1.3 10*3/uL (ref 0.7–4.0)
MCH: 28.3 pg (ref 26.0–34.0)
MCHC: 32.8 g/dL (ref 30.0–36.0)
MCV: 86.4 fL (ref 80.0–100.0)
Monocytes Absolute: 0.5 10*3/uL (ref 0.1–1.0)
Monocytes Relative: 5 %
Neutro Abs: 6.8 10*3/uL (ref 1.7–7.7)
Neutrophils Relative %: 79 %
Platelets: 103 10*3/uL — ABNORMAL LOW (ref 150–400)
RBC: 5.75 MIL/uL (ref 4.22–5.81)
RDW: 13.8 % (ref 11.5–15.5)
WBC: 8.7 10*3/uL (ref 4.0–10.5)
nRBC: 0 % (ref 0.0–0.2)

## 2023-06-25 NOTE — ED Triage Notes (Signed)
Pt. Arrives with wife for a food bolus stuck in his throat. Pt. States that he was eating [place] at about 1pm and it hasn't gone down. Pt. C/o pain in his chest from the food. Pt. Has dry heaving in triage. Unable to keep anything down.

## 2023-06-26 NOTE — ED Notes (Signed)
Pt and wife present at nurse's station informed this nurse that he has had a food bolus before, but that the piece of [place] came up as soon as he got back to the room. Pt reports he is feeling much better and is able to drink water now. Pt states he will call his GI doctor first thing tomorrow. Pt and wife report they no longer wish to be seen by ED provider and left to go home.

## 2023-07-11 DIAGNOSIS — K222 Esophageal obstruction: Secondary | ICD-10-CM | POA: Diagnosis not present

## 2023-07-11 DIAGNOSIS — R1310 Dysphagia, unspecified: Secondary | ICD-10-CM | POA: Diagnosis not present

## 2023-09-06 DIAGNOSIS — H59813 Chorioretinal scars after surgery for detachment, bilateral: Secondary | ICD-10-CM | POA: Diagnosis not present

## 2023-09-06 DIAGNOSIS — H33011 Retinal detachment with single break, right eye: Secondary | ICD-10-CM | POA: Diagnosis not present

## 2023-09-06 DIAGNOSIS — H35373 Puckering of macula, bilateral: Secondary | ICD-10-CM | POA: Diagnosis not present

## 2023-09-19 DIAGNOSIS — H33011 Retinal detachment with single break, right eye: Secondary | ICD-10-CM | POA: Diagnosis not present

## 2023-10-15 DIAGNOSIS — M25519 Pain in unspecified shoulder: Secondary | ICD-10-CM | POA: Diagnosis not present

## 2023-10-16 DIAGNOSIS — K080 Exfoliation of teeth due to systemic causes: Secondary | ICD-10-CM | POA: Diagnosis not present

## 2023-10-16 DIAGNOSIS — M25519 Pain in unspecified shoulder: Secondary | ICD-10-CM | POA: Diagnosis not present

## 2023-10-22 DIAGNOSIS — M25519 Pain in unspecified shoulder: Secondary | ICD-10-CM | POA: Diagnosis not present

## 2023-10-22 DIAGNOSIS — N529 Male erectile dysfunction, unspecified: Secondary | ICD-10-CM | POA: Diagnosis not present

## 2023-10-22 DIAGNOSIS — F524 Premature ejaculation: Secondary | ICD-10-CM | POA: Diagnosis not present

## 2023-10-24 DIAGNOSIS — M25519 Pain in unspecified shoulder: Secondary | ICD-10-CM | POA: Diagnosis not present

## 2023-10-29 DIAGNOSIS — M25519 Pain in unspecified shoulder: Secondary | ICD-10-CM | POA: Diagnosis not present

## 2023-11-05 DIAGNOSIS — M25519 Pain in unspecified shoulder: Secondary | ICD-10-CM | POA: Diagnosis not present

## 2023-11-07 DIAGNOSIS — H33011 Retinal detachment with single break, right eye: Secondary | ICD-10-CM | POA: Diagnosis not present

## 2023-11-07 DIAGNOSIS — H2513 Age-related nuclear cataract, bilateral: Secondary | ICD-10-CM | POA: Diagnosis not present

## 2023-11-07 DIAGNOSIS — H35373 Puckering of macula, bilateral: Secondary | ICD-10-CM | POA: Diagnosis not present

## 2023-11-07 DIAGNOSIS — M25519 Pain in unspecified shoulder: Secondary | ICD-10-CM | POA: Diagnosis not present

## 2023-11-07 DIAGNOSIS — H59813 Chorioretinal scars after surgery for detachment, bilateral: Secondary | ICD-10-CM | POA: Diagnosis not present

## 2023-11-12 DIAGNOSIS — M25519 Pain in unspecified shoulder: Secondary | ICD-10-CM | POA: Diagnosis not present

## 2023-11-13 DIAGNOSIS — Z860100 Personal history of colon polyps, unspecified: Secondary | ICD-10-CM | POA: Diagnosis not present

## 2023-11-13 DIAGNOSIS — K222 Esophageal obstruction: Secondary | ICD-10-CM | POA: Diagnosis not present

## 2023-11-14 DIAGNOSIS — M25519 Pain in unspecified shoulder: Secondary | ICD-10-CM | POA: Diagnosis not present

## 2023-11-17 DIAGNOSIS — M25519 Pain in unspecified shoulder: Secondary | ICD-10-CM | POA: Diagnosis not present

## 2023-11-19 DIAGNOSIS — M25519 Pain in unspecified shoulder: Secondary | ICD-10-CM | POA: Diagnosis not present

## 2023-11-21 DIAGNOSIS — M25519 Pain in unspecified shoulder: Secondary | ICD-10-CM | POA: Diagnosis not present

## 2023-11-22 DIAGNOSIS — K200 Eosinophilic esophagitis: Secondary | ICD-10-CM | POA: Diagnosis not present

## 2023-11-22 DIAGNOSIS — K2289 Other specified disease of esophagus: Secondary | ICD-10-CM | POA: Diagnosis not present

## 2023-11-22 DIAGNOSIS — K222 Esophageal obstruction: Secondary | ICD-10-CM | POA: Diagnosis not present

## 2023-11-22 DIAGNOSIS — R1310 Dysphagia, unspecified: Secondary | ICD-10-CM | POA: Diagnosis not present

## 2023-11-27 DIAGNOSIS — K200 Eosinophilic esophagitis: Secondary | ICD-10-CM | POA: Diagnosis not present

## 2023-11-27 DIAGNOSIS — M25519 Pain in unspecified shoulder: Secondary | ICD-10-CM | POA: Diagnosis not present

## 2023-12-06 DIAGNOSIS — R351 Nocturia: Secondary | ICD-10-CM | POA: Diagnosis not present

## 2023-12-06 DIAGNOSIS — F524 Premature ejaculation: Secondary | ICD-10-CM | POA: Diagnosis not present

## 2023-12-06 DIAGNOSIS — N529 Male erectile dysfunction, unspecified: Secondary | ICD-10-CM | POA: Diagnosis not present

## 2023-12-12 DIAGNOSIS — M25519 Pain in unspecified shoulder: Secondary | ICD-10-CM | POA: Diagnosis not present

## 2023-12-19 DIAGNOSIS — M25519 Pain in unspecified shoulder: Secondary | ICD-10-CM | POA: Diagnosis not present

## 2023-12-27 DIAGNOSIS — N529 Male erectile dysfunction, unspecified: Secondary | ICD-10-CM | POA: Diagnosis not present

## 2024-01-09 DIAGNOSIS — H59813 Chorioretinal scars after surgery for detachment, bilateral: Secondary | ICD-10-CM | POA: Diagnosis not present

## 2024-01-09 DIAGNOSIS — H35373 Puckering of macula, bilateral: Secondary | ICD-10-CM | POA: Diagnosis not present

## 2024-01-09 DIAGNOSIS — H2513 Age-related nuclear cataract, bilateral: Secondary | ICD-10-CM | POA: Diagnosis not present

## 2024-01-09 DIAGNOSIS — H33011 Retinal detachment with single break, right eye: Secondary | ICD-10-CM | POA: Diagnosis not present

## 2024-01-17 DIAGNOSIS — H33011 Retinal detachment with single break, right eye: Secondary | ICD-10-CM | POA: Diagnosis not present

## 2024-02-07 DIAGNOSIS — D696 Thrombocytopenia, unspecified: Secondary | ICD-10-CM | POA: Diagnosis not present

## 2024-02-07 DIAGNOSIS — Z125 Encounter for screening for malignant neoplasm of prostate: Secondary | ICD-10-CM | POA: Diagnosis not present

## 2024-02-07 DIAGNOSIS — L989 Disorder of the skin and subcutaneous tissue, unspecified: Secondary | ICD-10-CM | POA: Diagnosis not present

## 2024-02-07 DIAGNOSIS — E782 Mixed hyperlipidemia: Secondary | ICD-10-CM | POA: Diagnosis not present

## 2024-02-07 DIAGNOSIS — Z0000 Encounter for general adult medical examination without abnormal findings: Secondary | ICD-10-CM | POA: Diagnosis not present

## 2024-02-07 DIAGNOSIS — I10 Essential (primary) hypertension: Secondary | ICD-10-CM | POA: Diagnosis not present

## 2024-04-01 DIAGNOSIS — Z1283 Encounter for screening for malignant neoplasm of skin: Secondary | ICD-10-CM | POA: Diagnosis not present

## 2024-04-01 DIAGNOSIS — D225 Melanocytic nevi of trunk: Secondary | ICD-10-CM | POA: Diagnosis not present

## 2024-04-01 DIAGNOSIS — D044 Carcinoma in situ of skin of scalp and neck: Secondary | ICD-10-CM | POA: Diagnosis not present

## 2024-04-01 DIAGNOSIS — D485 Neoplasm of uncertain behavior of skin: Secondary | ICD-10-CM | POA: Diagnosis not present

## 2024-04-22 DIAGNOSIS — K080 Exfoliation of teeth due to systemic causes: Secondary | ICD-10-CM | POA: Diagnosis not present

## 2024-04-23 DIAGNOSIS — D485 Neoplasm of uncertain behavior of skin: Secondary | ICD-10-CM | POA: Diagnosis not present

## 2024-05-07 DIAGNOSIS — H35373 Puckering of macula, bilateral: Secondary | ICD-10-CM | POA: Diagnosis not present

## 2024-05-07 DIAGNOSIS — H33011 Retinal detachment with single break, right eye: Secondary | ICD-10-CM | POA: Diagnosis not present

## 2024-05-07 DIAGNOSIS — H59811 Chorioretinal scars after surgery for detachment, right eye: Secondary | ICD-10-CM | POA: Diagnosis not present

## 2024-05-07 DIAGNOSIS — H2513 Age-related nuclear cataract, bilateral: Secondary | ICD-10-CM | POA: Diagnosis not present

## 2024-05-27 DIAGNOSIS — D044 Carcinoma in situ of skin of scalp and neck: Secondary | ICD-10-CM | POA: Diagnosis not present

## 2024-06-12 DIAGNOSIS — N528 Other male erectile dysfunction: Secondary | ICD-10-CM | POA: Diagnosis not present

## 2024-06-12 DIAGNOSIS — N5201 Erectile dysfunction due to arterial insufficiency: Secondary | ICD-10-CM | POA: Diagnosis not present

## 2024-06-19 DIAGNOSIS — H33011 Retinal detachment with single break, right eye: Secondary | ICD-10-CM | POA: Diagnosis not present

## 2024-06-19 DIAGNOSIS — H33191 Other retinoschisis and retinal cysts, right eye: Secondary | ICD-10-CM | POA: Diagnosis not present
